# Patient Record
Sex: Female | Born: 1980 | Race: Black or African American | Hispanic: No | Marital: Single | Smoking: Never smoker
Health system: Southern US, Community
[De-identification: ages and names within clinical notes are randomized; demographics above are authoritative.]

## PROBLEM LIST (undated history)

## (undated) DIAGNOSIS — E119 Type 2 diabetes mellitus without complications: Secondary | ICD-10-CM

## (undated) DIAGNOSIS — R87629 Unspecified abnormal cytological findings in specimens from vagina: Secondary | ICD-10-CM

## (undated) DIAGNOSIS — J45909 Unspecified asthma, uncomplicated: Secondary | ICD-10-CM

## (undated) DIAGNOSIS — B009 Herpesviral infection, unspecified: Secondary | ICD-10-CM

## (undated) DIAGNOSIS — N809 Endometriosis, unspecified: Secondary | ICD-10-CM

## (undated) DIAGNOSIS — D219 Benign neoplasm of connective and other soft tissue, unspecified: Secondary | ICD-10-CM

## (undated) HISTORY — DX: Endometriosis, unspecified: N80.9

## (undated) HISTORY — DX: Unspecified asthma, uncomplicated: J45.909

## (undated) HISTORY — DX: Herpesviral infection, unspecified: B00.9

## (undated) HISTORY — DX: Type 2 diabetes mellitus without complications: E11.9

## (undated) HISTORY — PX: DILATION AND CURETTAGE OF UTERUS: SHX78

## (undated) HISTORY — PX: LEEP: SHX91

## (undated) HISTORY — DX: Unspecified abnormal cytological findings in specimens from vagina: R87.629

## (undated) HISTORY — DX: Benign neoplasm of connective and other soft tissue, unspecified: D21.9

## (undated) HISTORY — PX: COLPOSCOPY: SHX161

---

## 2003-11-03 ENCOUNTER — Emergency Department (HOSPITAL_COMMUNITY): Admission: EM | Admit: 2003-11-03 | Discharge: 2003-11-03 | Payer: Self-pay | Admitting: Family Medicine

## 2005-06-19 ENCOUNTER — Ambulatory Visit (HOSPITAL_COMMUNITY): Admission: RE | Admit: 2005-06-19 | Discharge: 2005-06-19 | Payer: Self-pay | Admitting: *Deleted

## 2006-03-23 IMAGING — US US ABDOMEN COMPLETE
1 series · 14 of 25 positions shown · non-contrast
Comparison: none

CLINICAL DATA: Evaluate gallbladder.  Post prandial pain.  
ABDOMEN ULTRASOUND:
TECHNIQUE: Complete abdominal ultrasound examination was performed including evaluation of the liver, gallbladder, bile ducts, pancreas, kidneys, spleen, IVC, and abdominal aorta.

[Series 1: us abdomen complete · 0.39mm/px · 14 of 73 slices shown]
[im 1/73]
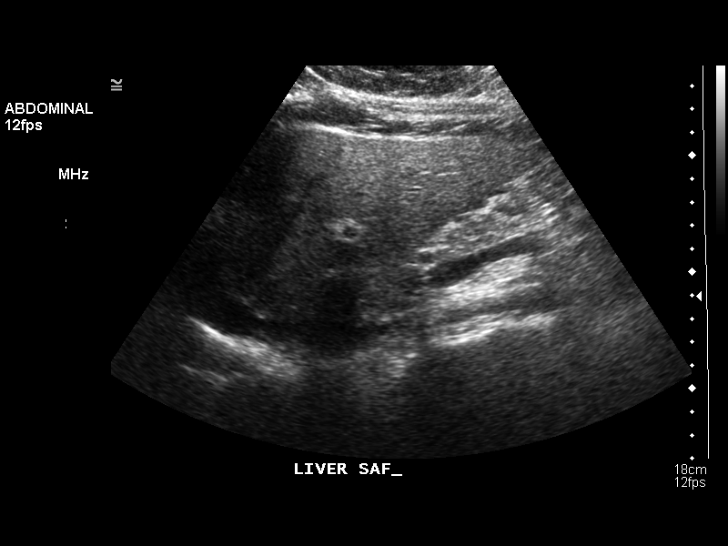
[im 7/73]
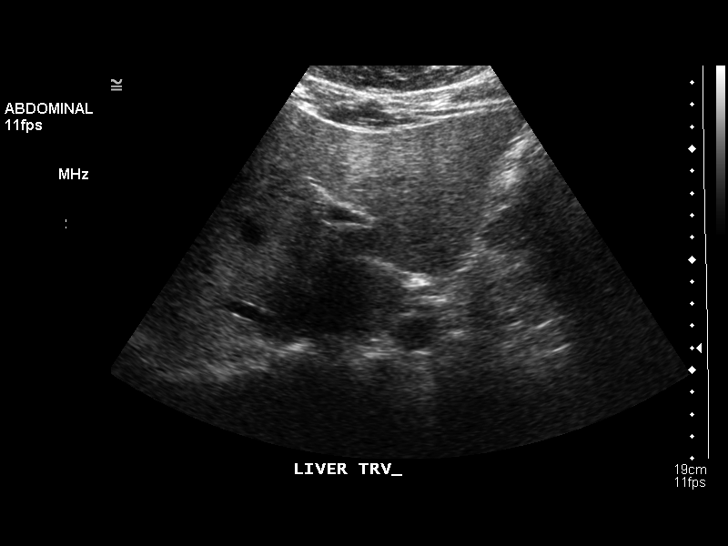
[im 13/73]
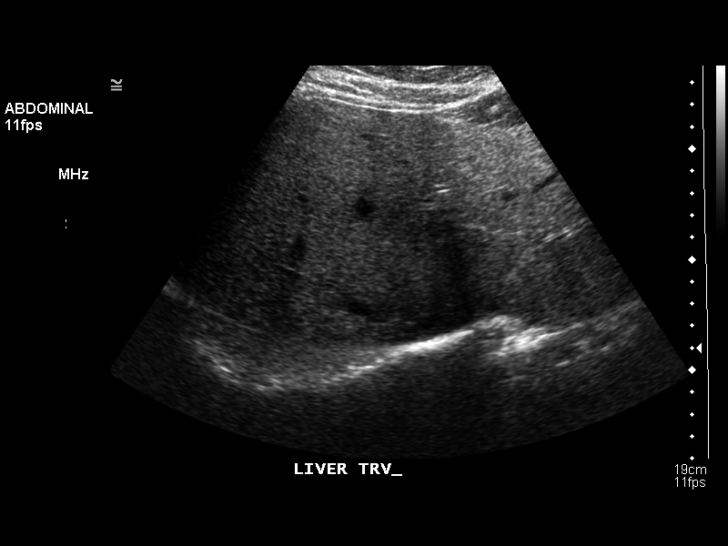
[im 19/73]
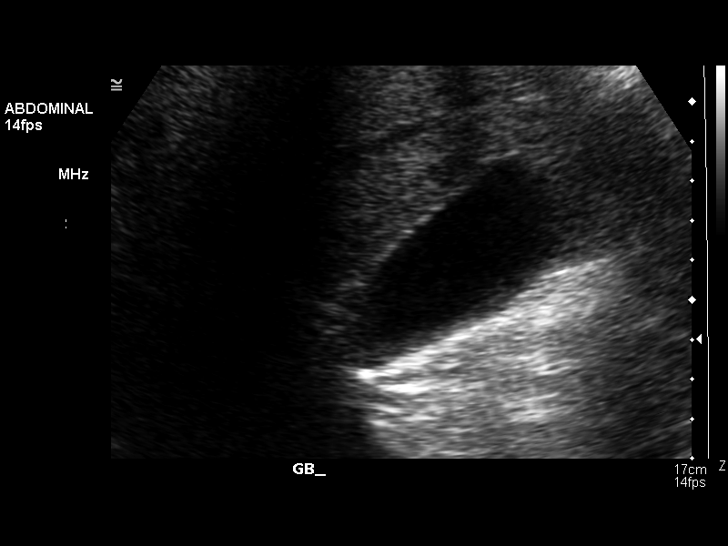
[im 25/73]
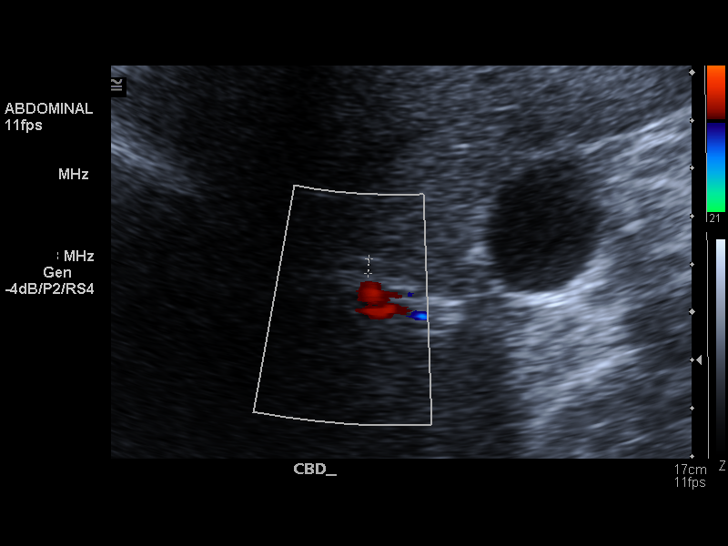
[im 28/73]
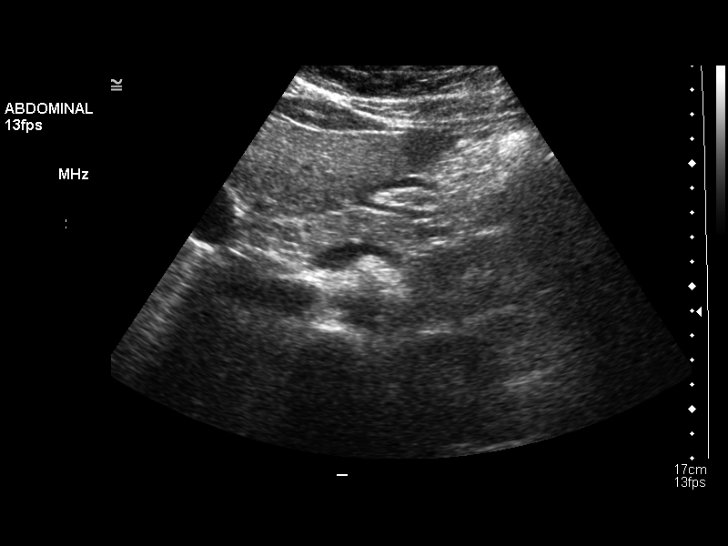
[im 34/73]
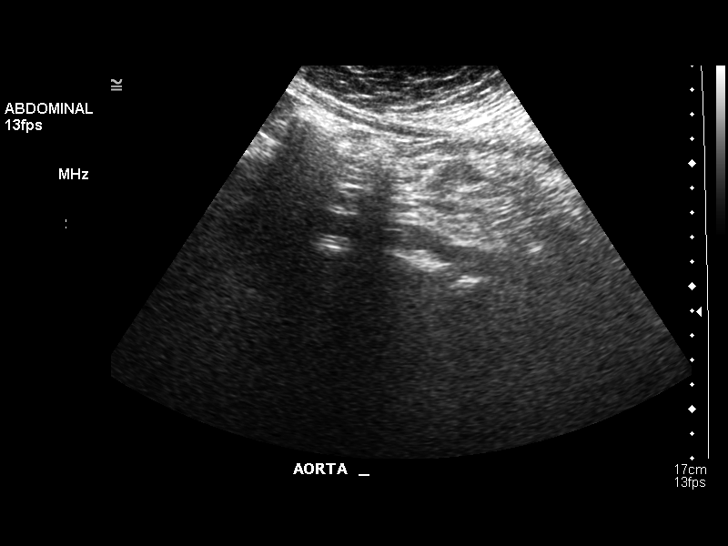
[im 40/73]
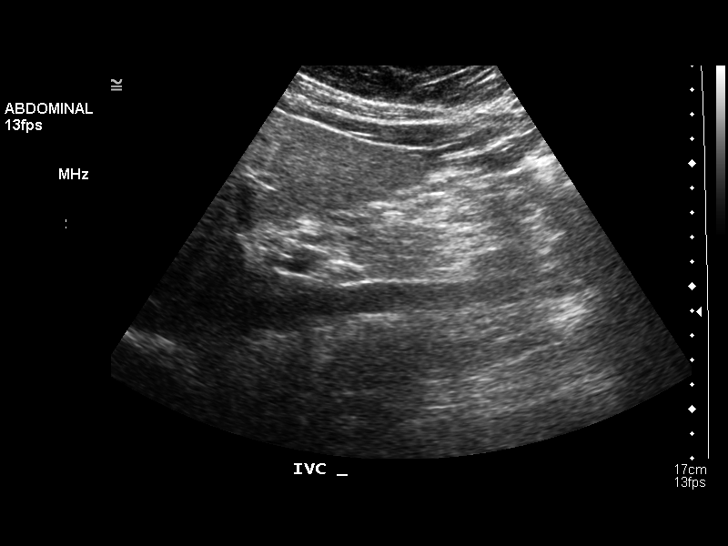
[im 46/73]
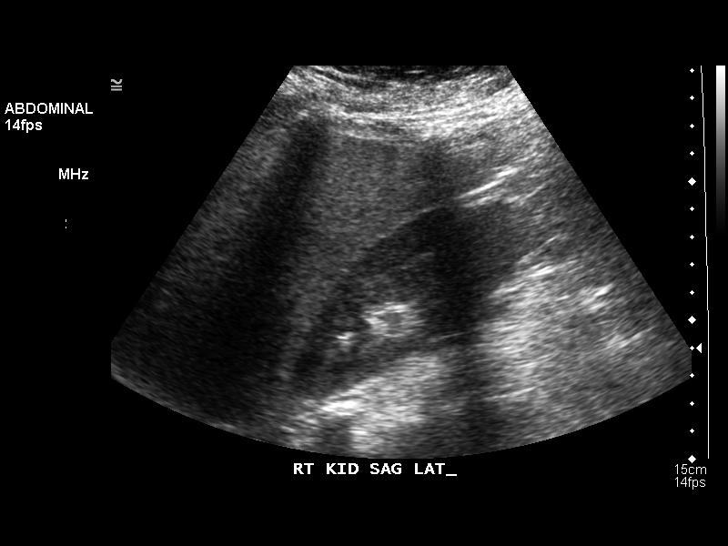
[im 49/73]
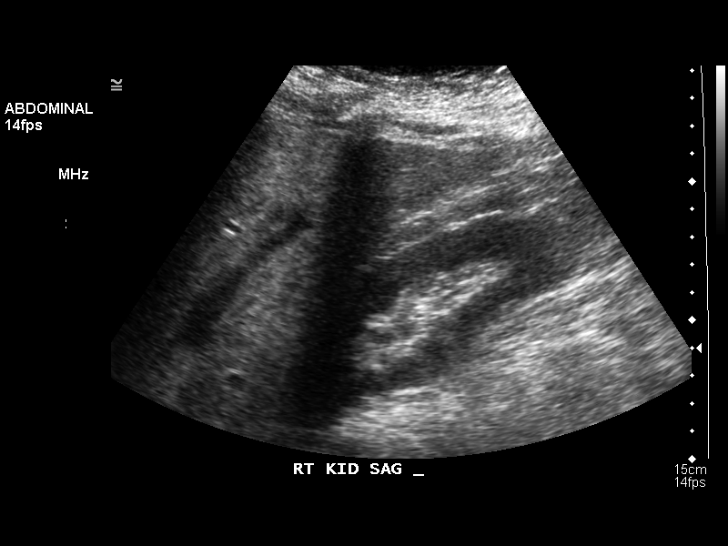
[im 55/73]
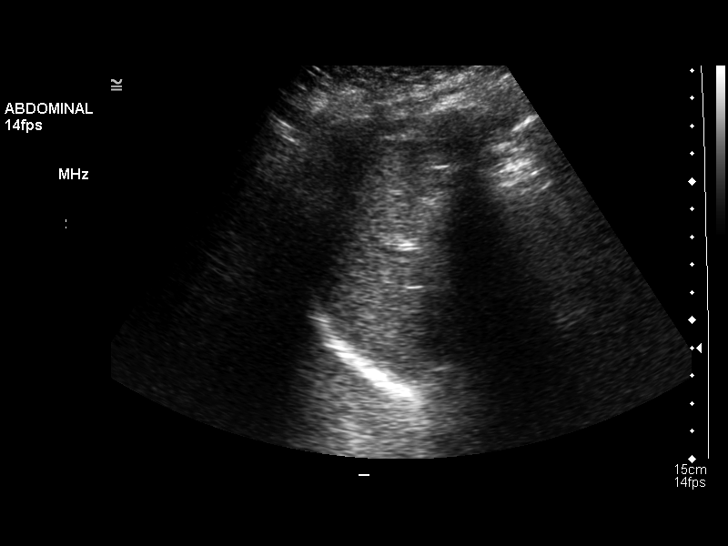
[im 61/73]
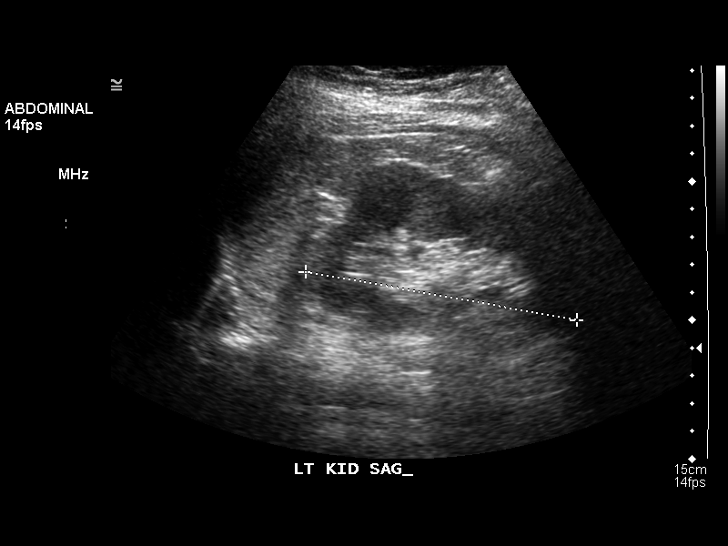
[im 67/73]
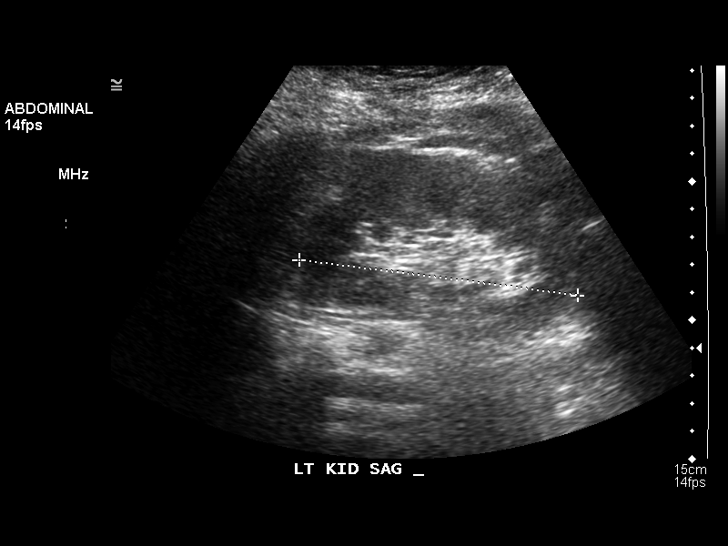
[im 73/73]
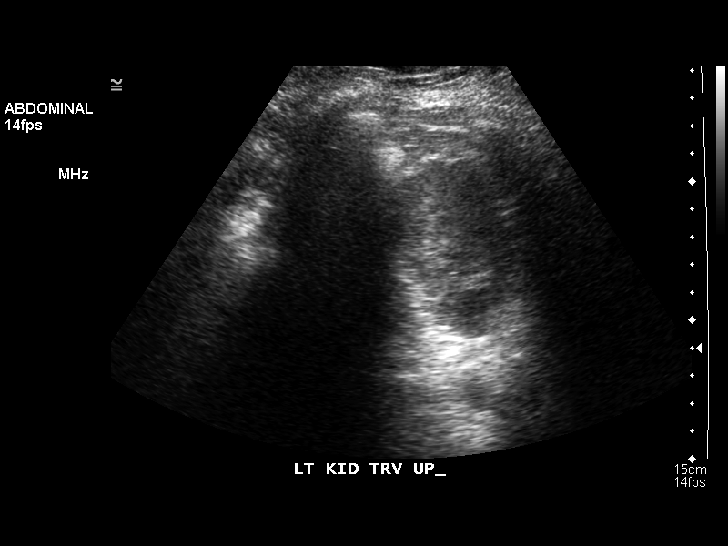

[14 of 25 positions shown; findings below may reference images not displayed]

FINDINGS: The liver parenchyma appears homogeneous with no signs of intrahepatic ductal dilatation.  The gallbladder is well distended and shows no evidence for intraluminal stones or sludge.  No pericholecystic fluid or gallbladder wall thickening is seen and the common bile duct measures 3 mm in AP width and this is within normal limits for size.  The pancreas is seen in its entirety and is normal in size and echotexture.  The proximal aspect of the inferior vena cava has a normal caliber and the aorta is normal in caliber with a maximal width of 1.3 cm.  
Both kidneys are seen with the right kidney having a sagittal length of 11.3 cm and the left kidney having a sagittal length of 10.4 cm.  No focal parenchymal abnormalities or signs of hydronephrosis are evident.  Spleen has a normal sonographic appearance. 
No intra-abdominal fluid is noted.
IMPRESSION: Normal abdominal ultrasound.

## 2006-04-21 ENCOUNTER — Emergency Department (HOSPITAL_COMMUNITY): Admission: EM | Admit: 2006-04-21 | Discharge: 2006-04-21 | Payer: Self-pay | Admitting: Emergency Medicine

## 2006-09-23 ENCOUNTER — Ambulatory Visit: Payer: Self-pay | Admitting: Internal Medicine

## 2007-01-23 IMAGING — CR DG CHEST 2V
2 series · 2 of 2 positions shown · non-contrast
Comparison: None.

CLINICAL DATA: 25-year-old with cough and chest pain. 
 CHEST ? 2 VIEW:

[w chest pa *]
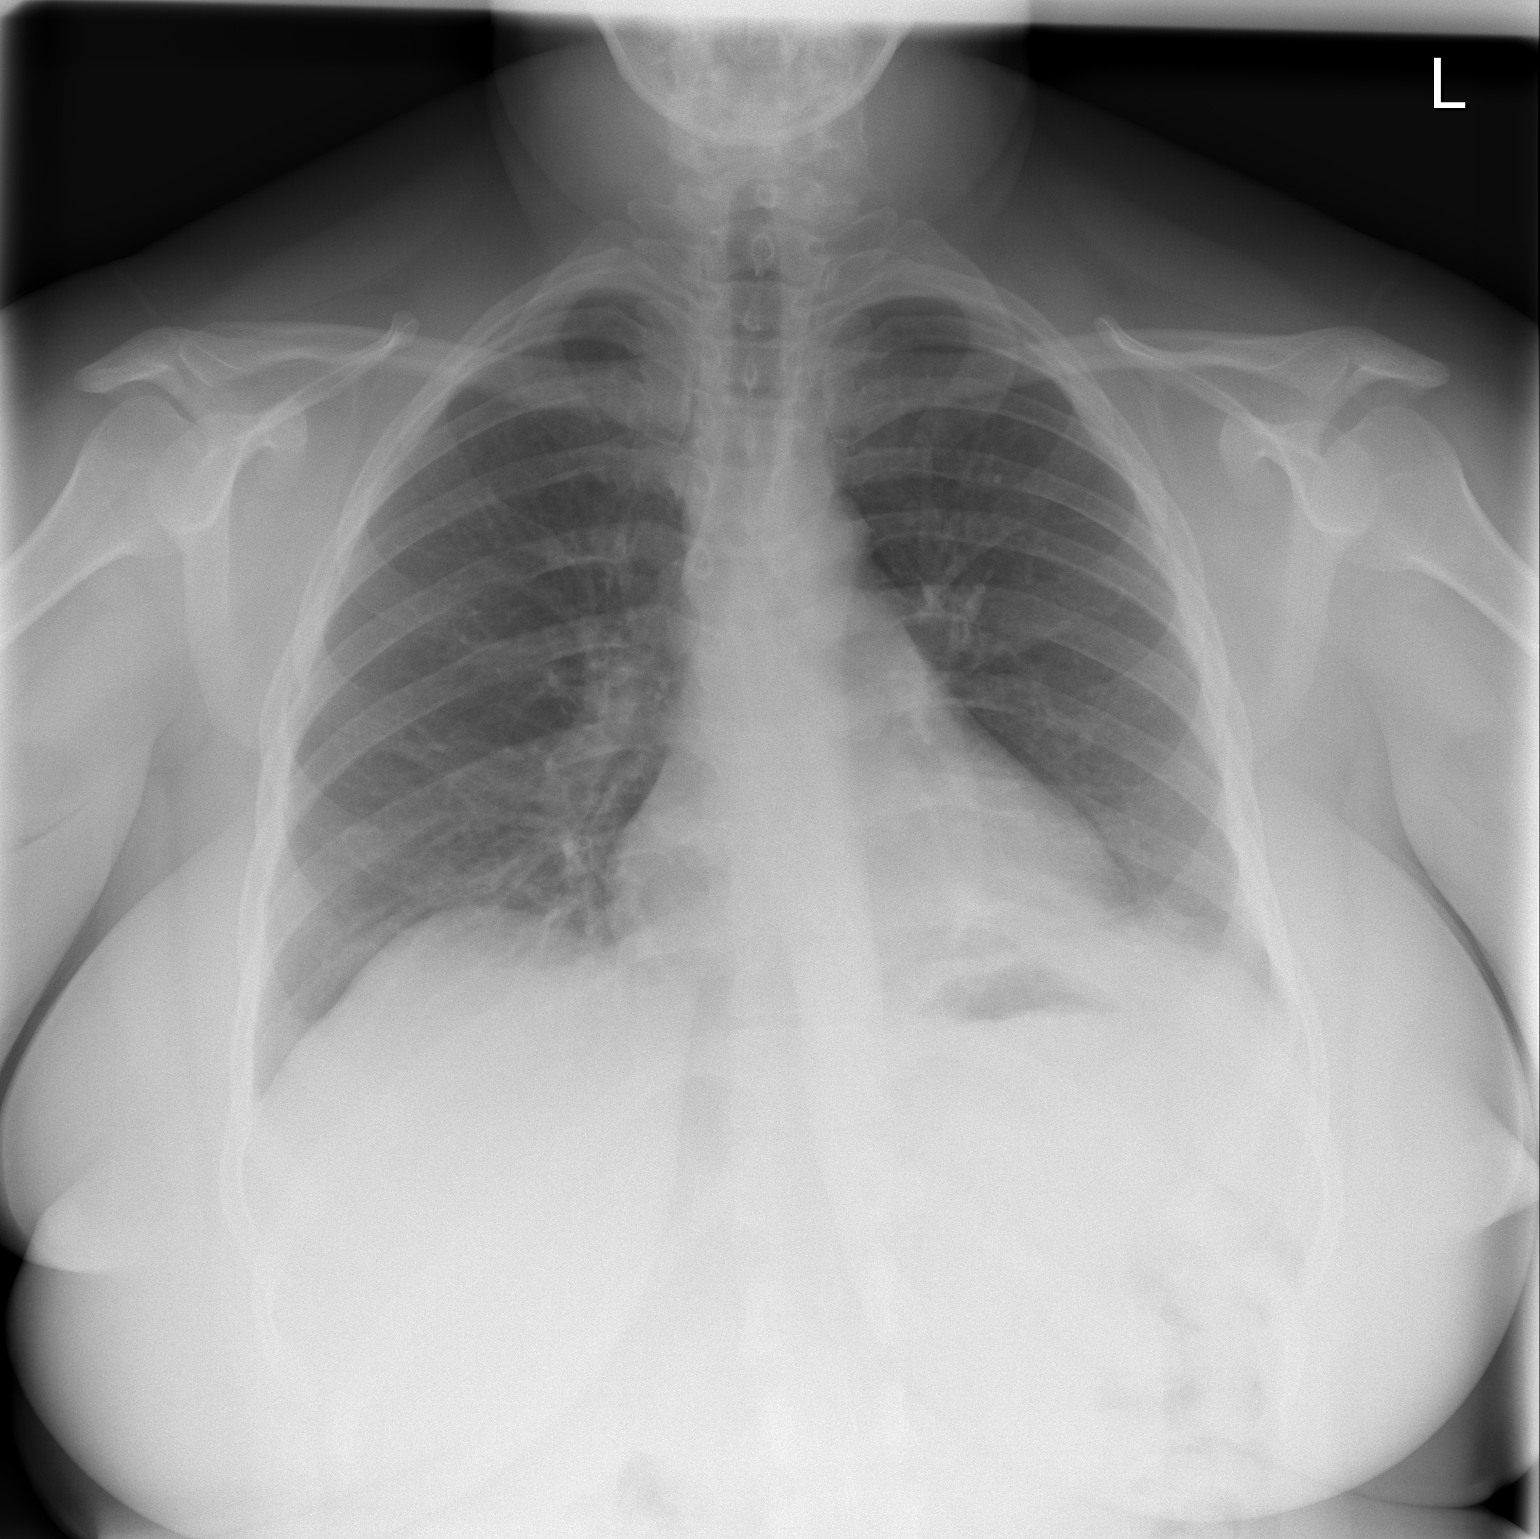

[w chest lat]
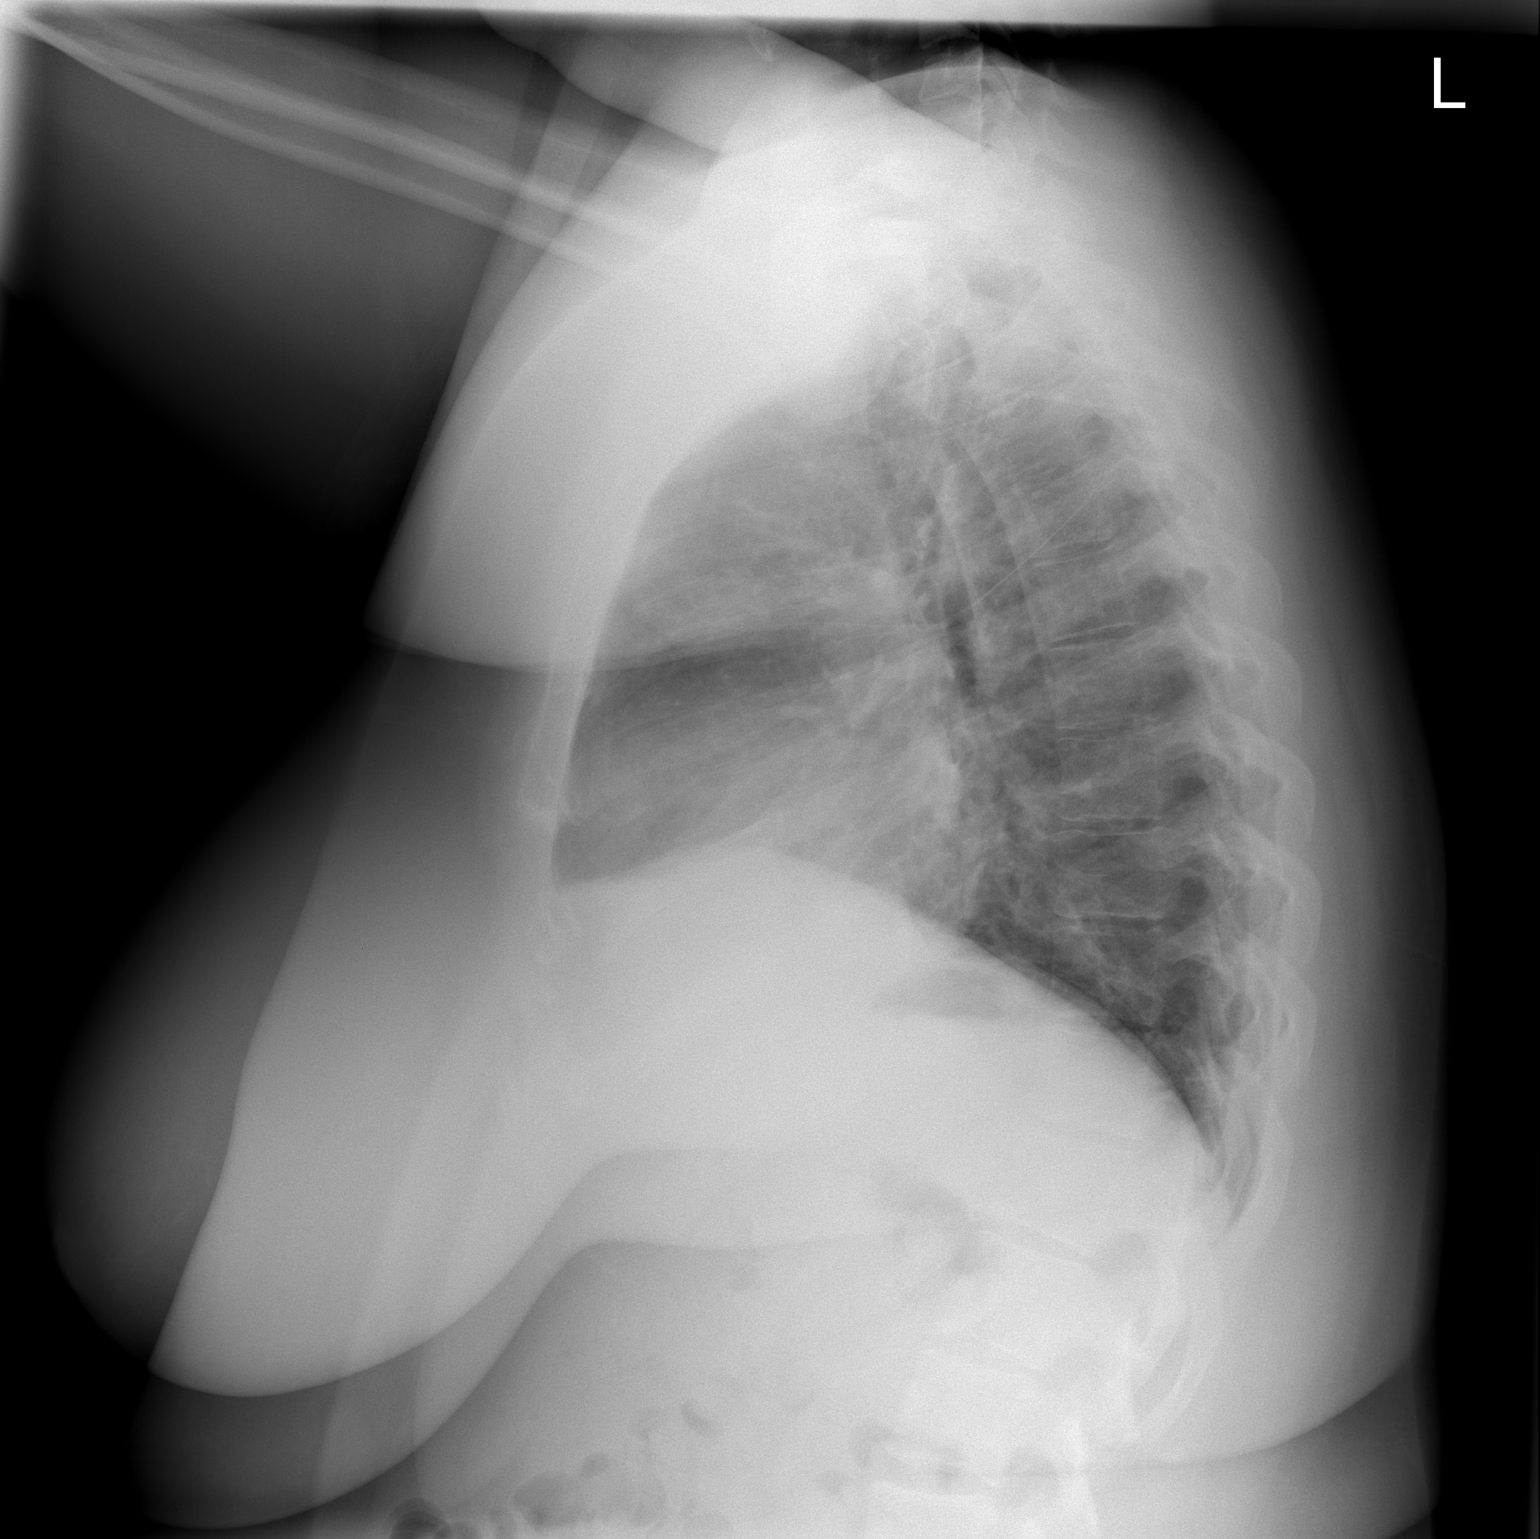

[2 of 2 positions shown; findings below may reference images not displayed]

FINDINGS: Cardiac silhouette, mediastinal and hilar contours are within normal limits.  Left lower lobe opacity may reflect atelectasis or an early infiltrate.  No effusion.  Bony structures are intact.
IMPRESSION: Probably early left lower lobe pneumonia.

## 2007-03-05 ENCOUNTER — Inpatient Hospital Stay (HOSPITAL_COMMUNITY): Admission: AD | Admit: 2007-03-05 | Discharge: 2007-03-05 | Payer: Self-pay | Admitting: Obstetrics

## 2007-03-06 ENCOUNTER — Inpatient Hospital Stay (HOSPITAL_COMMUNITY): Admission: AD | Admit: 2007-03-06 | Discharge: 2007-03-08 | Payer: Self-pay | Admitting: Obstetrics

## 2010-10-25 NOTE — Assessment & Plan Note (Signed)
[facility]                             PULMONARY OFFICE NOTE   ROTUNDA, WORDEN                     MRN:          [id number]  DATE:09/23/2006                            DOB:          [birth year]    REASON FOR CONSULTATION:  Asthma.   CHIEF COMPLAINT:  Dyspnea with chest congestion.   HISTORY:  A 30 year old black female, states she has had asthma all her  life but seemed to outgrow it after the age of 56 except for a bad  problem with colds, during which she gets sick for a couple of weeks  with chest congestion with discolored mucous.  She typically responds to  a round of antibiotics and a short course of albuterol.  She rarely  needs prednisone and does not need any albuterol between her colds.  However, she is now [redacted] weeks pregnant with her 2nd child (note, she was  50 pounds less with her 1st child 4 years ago with no asthma  exacerbation), with a 4-week history of a bad cold that won't go away.  Her main symptom is nocturnal wheeze, coughing, and congestion with  green mucous initially, now just yellow tinged, with increasing  frequency of albuterol use mostly during the evening and nighttime hours  while sleeping.  She denies any pleuritic pain, sinus pain, overt reflux  symptoms, associated fevers, chills, sweats, orthopnea, PND, or leg  swelling.   PAST MEDICAL HISTORY:  Significant for the absence of hypertension and  positive for asthma and allergies.  Although she denies typical  seasonal variation.   ALLERGIES:  None known.   SOCIAL HISTORY:  She has never smoked.  She works as a an Sales executive.   FAMILY HISTORY:  Significant for asthma in her father, and otherwise  negative for atopy or respiratory disease.   REVIEW OF SYSTEMS:  Taken in detail on the worksheet, negative except  for as outlined above.   PHYSICAL EXAMINATION:  This is a pleasant, ambulatory black female in no  acute distress.  Blood pressure 120/68.  HEENT:  Reveals mild turbinate edema, oropharynx clear.  No evidence of  excessive postnasal drip or cobblestoning.  No turbinate sinuses or  polyps or purulent secretions.  Ear canals are clear bilaterally.  NECK:  Supple without cervical adenopathy or tenderness.  Trachea is  midline, no thyromegaly.  Lung fields perfectly clear bilaterally to auscultation and percussion  except for minimum expiratory wheeze with FVC maneuver.  Heart appears regular rate and rhythm without murmurs, gallops, or rubs  or increasing P2.  ABDOMEN:  Obese and otherwise benign and consistent with intrauterine  pregnancy at 17 weeks.  EXTREMITIES:  Warm without calf tenderness, cyanosis, clubbing, or  edema.  Heme saturation is 100% on room air.   IMPRESSION:  Persistent asthma in a patient at [redacted] weeks gestation with  increasing need for nocturnal albuterol over the last 4 weeks.  I  explained to the patient that poor control of asthma is definitely a  risk factor for obstetrical complications, and recommended at this point  instituting Symbicort 80/4.5 two puffs b.i.d. perfectly regularly  until  she returns in 4 weeks.  The goal is to eliminate her symptoms and also  minimize the use of albuterol.  Perhaps she does have discolored sputum,  but this may well be eosinophilic bronchitis that should respond to the  Symbicort as well.  If not, I gave her a course of Augmentin to have on  hand for 10 days p.r.n.  Persistent purulent sputum, I asked her to use  either Mucinex or Robitussin over the counter products as needed.   I did also explain that her asthma may worsen before it gets better as  she gets more pregnant due to both the direct effects of the uterus on  the abdominal compliance, but indirect effect such as reflux and  increased work of breathing related to progestational hot ventilatory  drive issues.     Charlaine Dalton. Sherene Sires, MD, [facility]  Electronically Signed    MBW/MedQ  DD: 09/23/2006  DT:  09/23/2006  Job #: [id number]   cc:   Maxie Better, M.D.

## 2011-03-20 LAB — CBC
HCT: 32.2 — ABNORMAL LOW
Hemoglobin: 10.8 — ABNORMAL LOW
Hemoglobin: 9 — ABNORMAL LOW
MCHC: 33.7
MCV: 82.4
MCV: 83.1
Platelets: 195
Platelets: 206
RBC: 3.91
RDW: 15.6 — ABNORMAL HIGH
WBC: 14.2 — ABNORMAL HIGH

## 2017-02-25 ENCOUNTER — Encounter (HOSPITAL_COMMUNITY): Payer: Self-pay

## 2017-02-25 ENCOUNTER — Ambulatory Visit (HOSPITAL_COMMUNITY): Admit: 2017-02-25 | Payer: Self-pay | Admitting: Obstetrics and Gynecology

## 2017-02-25 SURGERY — HYSTERECTOMY, VAGINAL, LAPAROSCOPY-ASSISTED, WITH SALPINGECTOMY
Anesthesia: General | Laterality: Bilateral

## 2022-06-09 NOTE — L&D Delivery Note (Signed)
Delivery Note Labor onset:   11/28/22 Labor Onset Time: 0340 Complete dilation at 7:04 AM  Onset of pushing at 0825 FHR second stage Cat 2 Analgesia/Anesthesia intrapartum: epidural  Guided pushing with maternal urge. Delivery of a viable female at 46. Fetal head delivered in LOA position.  Nuchal cord: x1, loose, reduced.  Infant placed on maternal abd, dried, and tactile stim.  Cord double clamped after 4 min and cut by pt's friend, Marcelino Duster.  RN x3 present for birth.  Cord blood sample collected: Yes Arterial cord blood sample collected: No  Placenta delivered Tomasa Blase, intact, with 3 VC.  Placenta to L&D. Uterine tone firm, bleeding small with clots, remains firm after maassage  No laceration identified.  QBL/EBL (mL): 300 Complications: none APGAR: APGAR (1 MIN):   APGAR (5 MINS):   APGAR (10 MINS):   Mom to postpartum.  Baby to Couplet care / Skin to Skin. Baby boy "[place]" Circumcision Yes  Roma Schanz DNP, CNM 11/28/2022, 9:10 AM

## 2022-06-27 ENCOUNTER — Other Ambulatory Visit: Payer: Self-pay

## 2022-07-01 LAB — HEPATITIS C ANTIBODY: HCV Ab: NEGATIVE

## 2022-07-01 LAB — OB RESULTS CONSOLE ABO/RH: RH Type: POSITIVE

## 2022-07-01 LAB — OB RESULTS CONSOLE HIV ANTIBODY (ROUTINE TESTING): HIV: NONREACTIVE

## 2022-07-01 LAB — OB RESULTS CONSOLE RPR: RPR: NONREACTIVE

## 2022-07-01 LAB — OB RESULTS CONSOLE RUBELLA ANTIBODY, IGM: Rubella: IMMUNE

## 2022-07-01 LAB — OB RESULTS CONSOLE ANTIBODY SCREEN: Antibody Screen: NEGATIVE

## 2022-07-01 LAB — OB RESULTS CONSOLE HEPATITIS B SURFACE ANTIGEN: Hepatitis B Surface Ag: NEGATIVE

## 2022-07-04 ENCOUNTER — Other Ambulatory Visit: Payer: Self-pay | Admitting: Obstetrics and Gynecology

## 2022-07-04 DIAGNOSIS — E119 Type 2 diabetes mellitus without complications: Secondary | ICD-10-CM

## 2022-07-22 ENCOUNTER — Encounter: Payer: Self-pay | Admitting: *Deleted

## 2022-07-24 ENCOUNTER — Ambulatory Visit: Payer: PRIVATE HEALTH INSURANCE | Attending: Obstetrics and Gynecology

## 2022-07-24 ENCOUNTER — Encounter: Payer: Self-pay | Admitting: *Deleted

## 2022-07-24 ENCOUNTER — Other Ambulatory Visit: Payer: Self-pay | Admitting: *Deleted

## 2022-07-24 ENCOUNTER — Ambulatory Visit: Payer: PRIVATE HEALTH INSURANCE | Admitting: *Deleted

## 2022-07-24 VITALS — BP 106/64 | HR 88 | Ht 62.0 in

## 2022-07-24 DIAGNOSIS — Z362 Encounter for other antenatal screening follow-up: Secondary | ICD-10-CM

## 2022-07-24 DIAGNOSIS — O24112 Pre-existing diabetes mellitus, type 2, in pregnancy, second trimester: Secondary | ICD-10-CM | POA: Diagnosis present

## 2022-07-24 DIAGNOSIS — Z794 Long term (current) use of insulin: Secondary | ICD-10-CM

## 2022-07-24 DIAGNOSIS — O99212 Obesity complicating pregnancy, second trimester: Secondary | ICD-10-CM

## 2022-07-24 DIAGNOSIS — O09522 Supervision of elderly multigravida, second trimester: Secondary | ICD-10-CM

## 2022-07-24 DIAGNOSIS — E119 Type 2 diabetes mellitus without complications: Secondary | ICD-10-CM

## 2022-07-24 DIAGNOSIS — E669 Obesity, unspecified: Secondary | ICD-10-CM

## 2022-07-24 DIAGNOSIS — Z3A19 19 weeks gestation of pregnancy: Secondary | ICD-10-CM

## 2022-08-28 ENCOUNTER — Other Ambulatory Visit: Payer: Self-pay | Admitting: *Deleted

## 2022-08-28 ENCOUNTER — Ambulatory Visit: Payer: PRIVATE HEALTH INSURANCE | Admitting: *Deleted

## 2022-08-28 ENCOUNTER — Ambulatory Visit: Payer: PRIVATE HEALTH INSURANCE | Attending: Obstetrics and Gynecology

## 2022-08-28 VITALS — BP 99/67 | HR 84

## 2022-08-28 DIAGNOSIS — O24112 Pre-existing diabetes mellitus, type 2, in pregnancy, second trimester: Secondary | ICD-10-CM | POA: Diagnosis present

## 2022-08-28 DIAGNOSIS — E669 Obesity, unspecified: Secondary | ICD-10-CM

## 2022-08-28 DIAGNOSIS — Z362 Encounter for other antenatal screening follow-up: Secondary | ICD-10-CM | POA: Insufficient documentation

## 2022-08-28 DIAGNOSIS — Z3A24 24 weeks gestation of pregnancy: Secondary | ICD-10-CM

## 2022-08-28 DIAGNOSIS — O99212 Obesity complicating pregnancy, second trimester: Secondary | ICD-10-CM | POA: Diagnosis present

## 2022-08-28 DIAGNOSIS — E119 Type 2 diabetes mellitus without complications: Secondary | ICD-10-CM

## 2022-08-28 DIAGNOSIS — O09522 Supervision of elderly multigravida, second trimester: Secondary | ICD-10-CM | POA: Diagnosis present

## 2022-08-28 DIAGNOSIS — Z794 Long term (current) use of insulin: Secondary | ICD-10-CM

## 2022-09-25 ENCOUNTER — Other Ambulatory Visit: Payer: Self-pay | Admitting: *Deleted

## 2022-09-25 ENCOUNTER — Ambulatory Visit: Payer: PRIVATE HEALTH INSURANCE | Admitting: *Deleted

## 2022-09-25 ENCOUNTER — Ambulatory Visit: Payer: PRIVATE HEALTH INSURANCE | Attending: Maternal & Fetal Medicine

## 2022-09-25 VITALS — BP 104/63 | HR 82

## 2022-09-25 DIAGNOSIS — Z794 Long term (current) use of insulin: Secondary | ICD-10-CM

## 2022-09-25 DIAGNOSIS — Z3A28 28 weeks gestation of pregnancy: Secondary | ICD-10-CM

## 2022-09-25 DIAGNOSIS — O99212 Obesity complicating pregnancy, second trimester: Secondary | ICD-10-CM | POA: Diagnosis present

## 2022-09-25 DIAGNOSIS — E119 Type 2 diabetes mellitus without complications: Secondary | ICD-10-CM

## 2022-09-25 DIAGNOSIS — O24913 Unspecified diabetes mellitus in pregnancy, third trimester: Secondary | ICD-10-CM

## 2022-09-25 DIAGNOSIS — O09522 Supervision of elderly multigravida, second trimester: Secondary | ICD-10-CM | POA: Insufficient documentation

## 2022-09-25 DIAGNOSIS — O24113 Pre-existing diabetes mellitus, type 2, in pregnancy, third trimester: Secondary | ICD-10-CM | POA: Diagnosis not present

## 2022-09-25 DIAGNOSIS — O99213 Obesity complicating pregnancy, third trimester: Secondary | ICD-10-CM

## 2022-09-25 DIAGNOSIS — O24112 Pre-existing diabetes mellitus, type 2, in pregnancy, second trimester: Secondary | ICD-10-CM | POA: Insufficient documentation

## 2022-09-25 DIAGNOSIS — E669 Obesity, unspecified: Secondary | ICD-10-CM

## 2022-09-25 DIAGNOSIS — O09523 Supervision of elderly multigravida, third trimester: Secondary | ICD-10-CM | POA: Diagnosis not present

## 2022-10-21 DIAGNOSIS — O09529 Supervision of elderly multigravida, unspecified trimester: Secondary | ICD-10-CM | POA: Insufficient documentation

## 2022-10-21 DIAGNOSIS — O99210 Obesity complicating pregnancy, unspecified trimester: Secondary | ICD-10-CM | POA: Insufficient documentation

## 2022-10-21 DIAGNOSIS — O24113 Pre-existing diabetes mellitus, type 2, in pregnancy, third trimester: Secondary | ICD-10-CM | POA: Insufficient documentation

## 2022-10-21 NOTE — Assessment & Plan Note (Deleted)
Insulin required

## 2022-10-23 ENCOUNTER — Ambulatory Visit: Payer: PRIVATE HEALTH INSURANCE | Admitting: *Deleted

## 2022-10-23 ENCOUNTER — Ambulatory Visit: Payer: PRIVATE HEALTH INSURANCE | Attending: Obstetrics and Gynecology | Admitting: Maternal & Fetal Medicine

## 2022-10-23 ENCOUNTER — Ambulatory Visit: Payer: PRIVATE HEALTH INSURANCE | Attending: Maternal & Fetal Medicine

## 2022-10-23 VITALS — BP 99/49 | HR 89

## 2022-10-23 DIAGNOSIS — O99212 Obesity complicating pregnancy, second trimester: Secondary | ICD-10-CM | POA: Insufficient documentation

## 2022-10-23 DIAGNOSIS — O3663X0 Maternal care for excessive fetal growth, third trimester, not applicable or unspecified: Secondary | ICD-10-CM | POA: Diagnosis not present

## 2022-10-23 DIAGNOSIS — E669 Obesity, unspecified: Secondary | ICD-10-CM

## 2022-10-23 DIAGNOSIS — O09522 Supervision of elderly multigravida, second trimester: Secondary | ICD-10-CM | POA: Insufficient documentation

## 2022-10-23 DIAGNOSIS — O24113 Pre-existing diabetes mellitus, type 2, in pregnancy, third trimester: Secondary | ICD-10-CM | POA: Insufficient documentation

## 2022-10-23 DIAGNOSIS — Z3A32 32 weeks gestation of pregnancy: Secondary | ICD-10-CM | POA: Diagnosis not present

## 2022-10-23 DIAGNOSIS — O09523 Supervision of elderly multigravida, third trimester: Secondary | ICD-10-CM | POA: Insufficient documentation

## 2022-10-23 DIAGNOSIS — O99210 Obesity complicating pregnancy, unspecified trimester: Secondary | ICD-10-CM | POA: Insufficient documentation

## 2022-10-23 DIAGNOSIS — E119 Type 2 diabetes mellitus without complications: Secondary | ICD-10-CM

## 2022-10-23 DIAGNOSIS — O99213 Obesity complicating pregnancy, third trimester: Secondary | ICD-10-CM

## 2022-10-23 DIAGNOSIS — O24112 Pre-existing diabetes mellitus, type 2, in pregnancy, second trimester: Secondary | ICD-10-CM | POA: Insufficient documentation

## 2022-10-23 DIAGNOSIS — Z794 Long term (current) use of insulin: Secondary | ICD-10-CM

## 2022-10-23 NOTE — Progress Notes (Signed)
Patient information  Patient Name: Kylie Gamble  Patient MRN:   [id number]  Referring practice: MFM Referring Provider: [facility])  MFM CONSULT  Kylie Gamble is a 42 y.o. [id number] at [redacted]w[redacted]d here for ultrasound and consultation.    The patient reports fasting blood sugars ranging from the 80s to the low 100s.  She reports over 50% of the time it is over 95.  She also reports that her postprandial breakfast 2-hour sugar levels are in the upper 100s to 250.  However then she drops below 40 just before lunch.  The remaining of her postprandial blood sugar values 2 hours after eating are within the 120s to 150s.  She does not have her log with her but recalls this from memory.  She is currently taking Lantus 30 units at night Humalog 40 units with breakfast and 30 with lunch and dinner.  I instructed her to minimize carbohydrate and sugar intake especially at breakfast in addition to changing her medication.  I instructed her to increase her Lantus to 34 units at night and decrease her breakfast Humalog to 38 units.  I also recommend that every day she has fasting hyperglycemia she increase her Lantus by 2 units at night.  Hopefully the increase in protein and fats will help stabilize her blood sugar breakfast and throughout the day.  I discussed the clinical importance of proper glycemic control during pregnancy to minimize risk for things like stillbirth.  She verbalized understanding and is agreement with the plan.  Sonographic findings Single intrauterine pregnancy. Fetal cardiac activity: Observed. Presentation: Transverse, head to maternal left. Interval fetal anatomy appears normal.  Fetal biometry shows the estimated fetal weight at the 97 percentile.  Amniotic fluid volume: Within normal limits. AFI: 21.75 cm.  MVP: 7.41 cm. Placenta: Posterior. BPP: 8/8.   Assessment Type 2 diabetes mellitus affecting pregnancy in third trimester, antepartum  [redacted] weeks  gestation of pregnancy Plan -Minimize carbohydrate, artifical sweetener and sugar intake especially at breakfast -Increase Lantus to 34 units at night.   I also recommend that every day she has fasting hyperglycemia she increase her Lantus by 2 units at night.  -Decrease breakfast Humalog to 38 units. -Continue weekly antenatal testing and serial growth ultrasounds as scheduled. -Livery around 37 weeks due to large for gestational age fetus in the setting of uncontrolled diabetes  Review of Systems: A review of systems was performed and was negative except per HPI   Vitals and Physical Exam    10/23/2022    7:46 AM 09/25/2022    7:34 AM 08/28/2022    7:27 AM  Vitals with BMI  Systolic 99 104 99  Diastolic 49 63 67  Pulse 89 82 84  Sitting comfortably on the sonogram table Nonlabored breathing Normal rate and rhythm Abdomen is nontender  Past pregnancies OB History  Gravida Para Term Preterm AB Living  4 2 2   1 2   SAB IAB Ectopic Multiple Live Births    1          # Outcome Date GA Lbr Len/2nd Weight Sex Delivery Anes PTL Lv  4 Current           3 IAB           2 Term           1 Term             I spent 45 minutes reviewing the patients chart, including labs and images  as well as counseling the patient about her medical conditions. Greater than 50% of the time was spent in direct face-to-face patient counseling.  Braxton Feathers  MFM, [facility]   10/23/2022  10:05 AM

## 2022-10-30 ENCOUNTER — Ambulatory Visit: Payer: PRIVATE HEALTH INSURANCE | Admitting: *Deleted

## 2022-10-30 ENCOUNTER — Ambulatory Visit: Payer: PRIVATE HEALTH INSURANCE | Attending: Maternal & Fetal Medicine | Admitting: *Deleted

## 2022-10-30 VITALS — BP 120/64 | HR 85

## 2022-10-30 DIAGNOSIS — Z3A33 33 weeks gestation of pregnancy: Secondary | ICD-10-CM | POA: Diagnosis not present

## 2022-10-30 DIAGNOSIS — O24113 Pre-existing diabetes mellitus, type 2, in pregnancy, third trimester: Secondary | ICD-10-CM

## 2022-10-30 DIAGNOSIS — O24414 Gestational diabetes mellitus in pregnancy, insulin controlled: Secondary | ICD-10-CM

## 2022-10-30 DIAGNOSIS — O99213 Obesity complicating pregnancy, third trimester: Secondary | ICD-10-CM | POA: Insufficient documentation

## 2022-10-30 DIAGNOSIS — O09523 Supervision of elderly multigravida, third trimester: Secondary | ICD-10-CM | POA: Diagnosis not present

## 2022-10-30 NOTE — Procedures (Signed)
Kylie Gamble Feb 02, 1981 [redacted]w[redacted]d  Fetus A Non-Stress Test Interpretation for 10/30/22  Indication: Diabetes and Advanced Maternal Age >40 years  Fetal Heart Rate A Mode: External Baseline Rate (A): 135 bpm Variability: Moderate Accelerations: 15 x 15 Decelerations: None Multiple birth?: No  Uterine Activity Mode: Toco Contraction Frequency (min): none Resting Tone Palpated: Relaxed  Interpretation (Fetal Testing) Nonstress Test Interpretation: Reactive Overall Impression: Reassuring for gestational age Comments: Tracing reviewed byDr. Parke Poisson

## 2022-11-06 ENCOUNTER — Other Ambulatory Visit: Payer: Self-pay | Admitting: Obstetrics and Gynecology

## 2022-11-06 ENCOUNTER — Ambulatory Visit: Payer: PRIVATE HEALTH INSURANCE | Attending: Maternal & Fetal Medicine | Admitting: *Deleted

## 2022-11-06 ENCOUNTER — Ambulatory Visit: Payer: PRIVATE HEALTH INSURANCE | Attending: Obstetrics and Gynecology | Admitting: *Deleted

## 2022-11-06 ENCOUNTER — Ambulatory Visit: Payer: PRIVATE HEALTH INSURANCE

## 2022-11-06 ENCOUNTER — Ambulatory Visit (HOSPITAL_BASED_OUTPATIENT_CLINIC_OR_DEPARTMENT_OTHER): Payer: PRIVATE HEALTH INSURANCE

## 2022-11-06 VITALS — BP 114/66 | HR 102

## 2022-11-06 DIAGNOSIS — O99213 Obesity complicating pregnancy, third trimester: Secondary | ICD-10-CM

## 2022-11-06 DIAGNOSIS — O3663X0 Maternal care for excessive fetal growth, third trimester, not applicable or unspecified: Secondary | ICD-10-CM | POA: Diagnosis not present

## 2022-11-06 DIAGNOSIS — O24113 Pre-existing diabetes mellitus, type 2, in pregnancy, third trimester: Secondary | ICD-10-CM

## 2022-11-06 DIAGNOSIS — O09523 Supervision of elderly multigravida, third trimester: Secondary | ICD-10-CM | POA: Insufficient documentation

## 2022-11-06 DIAGNOSIS — Z3A37 37 weeks gestation of pregnancy: Secondary | ICD-10-CM | POA: Diagnosis not present

## 2022-11-06 DIAGNOSIS — Z362 Encounter for other antenatal screening follow-up: Secondary | ICD-10-CM | POA: Diagnosis not present

## 2022-11-06 DIAGNOSIS — Z794 Long term (current) use of insulin: Secondary | ICD-10-CM

## 2022-11-06 DIAGNOSIS — O24913 Unspecified diabetes mellitus in pregnancy, third trimester: Secondary | ICD-10-CM

## 2022-11-06 DIAGNOSIS — E11649 Type 2 diabetes mellitus with hypoglycemia without coma: Secondary | ICD-10-CM | POA: Diagnosis present

## 2022-11-06 DIAGNOSIS — E119 Type 2 diabetes mellitus without complications: Secondary | ICD-10-CM | POA: Diagnosis not present

## 2022-11-06 DIAGNOSIS — Z3A34 34 weeks gestation of pregnancy: Secondary | ICD-10-CM

## 2022-11-06 DIAGNOSIS — E669 Obesity, unspecified: Secondary | ICD-10-CM

## 2022-11-06 DIAGNOSIS — O403XX0 Polyhydramnios, third trimester, not applicable or unspecified: Secondary | ICD-10-CM | POA: Diagnosis not present

## 2022-11-06 NOTE — Procedures (Signed)
Kylie Gamble 02/03/1981 [redacted]w[redacted]d  Fetus A Non-Stress Test Interpretation for 11/06/22  Indication: Diabetes and Advanced Maternal Age >40 years  Fetal Heart Rate A Mode: External Baseline Rate (A): 130 bpm Variability: Moderate Accelerations: 15 x 15 Decelerations: None Multiple birth?: No  Uterine Activity Mode: Toco Contraction Frequency (min): none Resting Tone Palpated: Relaxed  Interpretation (Fetal Testing) Nonstress Test Interpretation: Reactive Overall Impression: Reassuring for gestational age Comments: Tracing reviewed byDr. Judeth Cornfield

## 2022-11-06 NOTE — Procedures (Signed)
KAIELLE RESTA 1981-05-12 [redacted]w[redacted]d  Fetus A Non-Stress Test Interpretation for 11/06/22  Indication: Gestational Diabetes medication controlled and Advanced Maternal Age >40 years  Fetal Heart Rate A Mode: External Baseline Rate (A): 130 bpm Variability: Moderate Accelerations: 15 x 15 Decelerations: None Multiple birth?: No  Uterine Activity Mode: Toco Contraction Frequency (min): none Resting Tone Palpated: Relaxed  Interpretation (Fetal Testing) Nonstress Test Interpretation: Reactive Overall Impression: Reassuring for gestational age Comments: Tracing reviewed byDr. Judeth Cornfield

## 2022-11-13 ENCOUNTER — Ambulatory Visit: Payer: PRIVATE HEALTH INSURANCE | Attending: Maternal & Fetal Medicine | Admitting: *Deleted

## 2022-11-13 ENCOUNTER — Ambulatory Visit: Payer: PRIVATE HEALTH INSURANCE | Admitting: *Deleted

## 2022-11-13 VITALS — BP 115/48 | HR 112

## 2022-11-13 DIAGNOSIS — Z3A35 35 weeks gestation of pregnancy: Secondary | ICD-10-CM | POA: Diagnosis not present

## 2022-11-13 DIAGNOSIS — O24113 Pre-existing diabetes mellitus, type 2, in pregnancy, third trimester: Secondary | ICD-10-CM | POA: Insufficient documentation

## 2022-11-13 DIAGNOSIS — O99213 Obesity complicating pregnancy, third trimester: Secondary | ICD-10-CM

## 2022-11-13 LAB — OB RESULTS CONSOLE GBS: GBS: POSITIVE

## 2022-11-13 NOTE — Procedures (Cosign Needed Addendum)
Kylie Gamble [birth year] [redacted]w[redacted]d  Fetus A Non-Stress Test Interpretation for 11/13/22  Indication: Diabetes and Advanced Maternal Age >40 years  Fetal Heart Rate A Mode: External Baseline Rate (A): 135 bpm Variability: Moderate Accelerations: 15 x 15 Decelerations: None Multiple birth?: No  Uterine Activity Mode: Toco Contraction Frequency (min): one u/c during NST Contraction Duration (sec): 90 Contraction Quality: Mild Resting Tone Palpated: Relaxed  Interpretation (Fetal Testing) Nonstress Test Interpretation: Reactive Overall Impression: Reassuring for gestational age Comments: tracing reviewed byDr. Darra Lis

## 2022-11-13 NOTE — Progress Notes (Signed)
Pt c/o RUQ pain that is achey in nature for the past two weeks. She also is very swollen from her knees down to her toes; although not pitting. States baby has decreased movement for the past three days.

## 2022-11-20 ENCOUNTER — Ambulatory Visit: Payer: PRIVATE HEALTH INSURANCE | Admitting: *Deleted

## 2022-11-20 ENCOUNTER — Ambulatory Visit: Payer: PRIVATE HEALTH INSURANCE | Attending: Maternal & Fetal Medicine

## 2022-11-20 VITALS — BP 101/61 | HR 77

## 2022-11-20 DIAGNOSIS — O09523 Supervision of elderly multigravida, third trimester: Secondary | ICD-10-CM | POA: Insufficient documentation

## 2022-11-20 DIAGNOSIS — O99212 Obesity complicating pregnancy, second trimester: Secondary | ICD-10-CM | POA: Insufficient documentation

## 2022-11-20 DIAGNOSIS — O24113 Pre-existing diabetes mellitus, type 2, in pregnancy, third trimester: Secondary | ICD-10-CM

## 2022-11-20 DIAGNOSIS — O3663X0 Maternal care for excessive fetal growth, third trimester, not applicable or unspecified: Secondary | ICD-10-CM | POA: Diagnosis not present

## 2022-11-20 DIAGNOSIS — O24112 Pre-existing diabetes mellitus, type 2, in pregnancy, second trimester: Secondary | ICD-10-CM | POA: Diagnosis present

## 2022-11-20 DIAGNOSIS — O09522 Supervision of elderly multigravida, second trimester: Secondary | ICD-10-CM | POA: Diagnosis present

## 2022-11-20 DIAGNOSIS — E669 Obesity, unspecified: Secondary | ICD-10-CM

## 2022-11-20 DIAGNOSIS — Z794 Long term (current) use of insulin: Secondary | ICD-10-CM | POA: Diagnosis not present

## 2022-11-20 DIAGNOSIS — Z3A36 36 weeks gestation of pregnancy: Secondary | ICD-10-CM

## 2022-11-21 ENCOUNTER — Telehealth (HOSPITAL_COMMUNITY): Payer: Self-pay | Admitting: *Deleted

## 2022-11-21 NOTE — Telephone Encounter (Signed)
Preadmission screen  

## 2022-11-25 ENCOUNTER — Encounter (HOSPITAL_COMMUNITY): Payer: Self-pay

## 2022-11-27 ENCOUNTER — Other Ambulatory Visit: Payer: Self-pay

## 2022-11-27 ENCOUNTER — Inpatient Hospital Stay (HOSPITAL_COMMUNITY)
Admission: RE | Admit: 2022-11-27 | Discharge: 2022-11-30 | DRG: 805 | Disposition: A | Discharge status: Home / Self Care | Payer: PRIVATE HEALTH INSURANCE | Attending: Obstetrics & Gynecology | Admitting: Obstetrics & Gynecology

## 2022-11-27 ENCOUNTER — Encounter (HOSPITAL_COMMUNITY): Payer: Self-pay | Admitting: Obstetrics and Gynecology

## 2022-11-27 DIAGNOSIS — I890 Lymphedema, not elsewhere classified: Secondary | ICD-10-CM | POA: Diagnosis not present

## 2022-11-27 DIAGNOSIS — O3663X0 Maternal care for excessive fetal growth, third trimester, not applicable or unspecified: Secondary | ICD-10-CM | POA: Diagnosis present

## 2022-11-27 DIAGNOSIS — Z3A37 37 weeks gestation of pregnancy: Secondary | ICD-10-CM

## 2022-11-27 DIAGNOSIS — O99214 Obesity complicating childbirth: Secondary | ICD-10-CM | POA: Diagnosis present

## 2022-11-27 DIAGNOSIS — O9942 Diseases of the circulatory system complicating childbirth: Secondary | ICD-10-CM | POA: Diagnosis not present

## 2022-11-27 DIAGNOSIS — E1165 Type 2 diabetes mellitus with hyperglycemia: Secondary | ICD-10-CM | POA: Diagnosis present

## 2022-11-27 DIAGNOSIS — A6000 Herpesviral infection of urogenital system, unspecified: Secondary | ICD-10-CM | POA: Diagnosis present

## 2022-11-27 DIAGNOSIS — J45909 Unspecified asthma, uncomplicated: Secondary | ICD-10-CM | POA: Diagnosis present

## 2022-11-27 DIAGNOSIS — Z794 Long term (current) use of insulin: Secondary | ICD-10-CM

## 2022-11-27 DIAGNOSIS — O99824 Streptococcus B carrier state complicating childbirth: Secondary | ICD-10-CM | POA: Diagnosis present

## 2022-11-27 DIAGNOSIS — O2412 Pre-existing diabetes mellitus, type 2, in childbirth: Principal | ICD-10-CM | POA: Diagnosis present

## 2022-11-27 DIAGNOSIS — O9832 Other infections with a predominantly sexual mode of transmission complicating childbirth: Secondary | ICD-10-CM | POA: Diagnosis present

## 2022-11-27 DIAGNOSIS — O24319 Unspecified pre-existing diabetes mellitus in pregnancy, unspecified trimester: Principal | ICD-10-CM | POA: Diagnosis present

## 2022-11-27 DIAGNOSIS — O9952 Diseases of the respiratory system complicating childbirth: Secondary | ICD-10-CM | POA: Diagnosis present

## 2022-11-27 LAB — COMPREHENSIVE METABOLIC PANEL
ALT: 14 U/L (ref 0–44)
AST: 21 U/L (ref 15–41)
Albumin: 3 g/dL — ABNORMAL LOW (ref 3.5–5.0)
Alkaline Phosphatase: 83 U/L (ref 38–126)
Anion gap: 12 (ref 5–15)
BUN: 8 mg/dL (ref 6–20)
CO2: 18 mmol/L — ABNORMAL LOW (ref 22–32)
Calcium: 9.3 mg/dL (ref 8.9–10.3)
Chloride: 105 mmol/L (ref 98–111)
Creatinine, Ser: 0.63 mg/dL (ref 0.44–1.00)
GFR, Estimated: >60 mL/min (ref >60)
Glucose, Bld: 88 mg/dL (ref 70–99)
Potassium: 3.9 mmol/L (ref 3.5–5.1)
Sodium: 135 mmol/L (ref 135–145)
Total Bilirubin: 0.8 mg/dL (ref 0.3–1.2)
Total Protein: 7.3 g/dL (ref 6.5–8.1)

## 2022-11-27 LAB — TYPE AND SCREEN
ABO/RH(D): A POS
Antibody Screen: NEGATIVE

## 2022-11-27 LAB — PROTEIN / CREATININE RATIO, URINE
Creatinine, Urine: 194 mg/dL
Protein Creatinine Ratio: 0.1 mg/mg{Cre} (ref 0.00–0.15)
Total Protein, Urine: 19 mg/dL

## 2022-11-27 LAB — CBC
HCT: 38.2 % (ref 36.0–46.0)
Hemoglobin: 12.5 g/dL (ref 12.0–15.0)
MCH: 28.6 pg (ref 26.0–34.0)
MCHC: 32.7 g/dL (ref 30.0–36.0)
MCV: 87.4 fL (ref 80.0–100.0)
Platelets: 181 10*3/uL (ref 150–400)
RBC: 4.37 MIL/uL (ref 3.87–5.11)
RDW: 14.6 % (ref 11.5–15.5)
WBC: 6.8 10*3/uL (ref 4.0–10.5)
nRBC: 0 % (ref 0.0–0.2)

## 2022-11-27 LAB — GLUCOSE, CAPILLARY: Glucose-Capillary: 80 mg/dL (ref 70–99)

## 2022-11-27 MED ORDER — DEXTROSE IN LACTATED RINGERS 5 % IV SOLN: INTRAVENOUS | Status: DC

## 2022-11-27 MED ORDER — OXYTOCIN-SODIUM CHLORIDE 30-0.9 UT/500ML-% IV SOLN: 2.5000 [IU]/h | INTRAVENOUS | Status: DC

## 2022-11-27 MED ORDER — MISOPROSTOL 25 MCG QUARTER TABLET
25.0000 ug | ORAL_TABLET | ORAL | Status: DC | PRN
  Administered 2022-11-27 (×2): 25 ug via VAGINAL
  Filled 2022-11-27 (×2): qty 1

## 2022-11-27 MED ORDER — INSULIN REGULAR(HUMAN) IN NACL 100-0.9 UT/100ML-% IV SOLN
INTRAVENOUS | Status: DC
  Administered 2022-11-27: 4 [IU]/h via INTRAVENOUS
  Administered 2022-11-27: 0.9 [IU]/h via INTRAVENOUS
  Filled 2022-11-27: qty 100

## 2022-11-27 MED ORDER — SOD CITRATE-CITRIC ACID 500-334 MG/5ML PO SOLN: 30.0000 mL | ORAL | Status: DC | PRN

## 2022-11-27 MED ORDER — DEXTROSE 50 % IV SOLN
0.0000 mL | INTRAVENOUS | Status: DC | PRN
  Administered 2022-11-28: 30 mL via INTRAVENOUS
  Filled 2022-11-27: qty 50

## 2022-11-27 MED ORDER — PENICILLIN G POT IN DEXTROSE 60000 UNIT/ML IV SOLN: 3.0000 10*6.[IU] | INTRAVENOUS | Status: DC

## 2022-11-27 MED ORDER — ACETAMINOPHEN 325 MG PO TABS: 650.0000 mg | ORAL_TABLET | ORAL | Status: DC | PRN

## 2022-11-27 MED ORDER — OXYTOCIN-SODIUM CHLORIDE 30-0.9 UT/500ML-% IV SOLN
1.0000 m[IU]/min | INTRAVENOUS | Status: DC
  Filled 2022-11-27: qty 500

## 2022-11-27 MED ORDER — LACTATED RINGERS IV SOLN: INTRAVENOUS | Status: DC

## 2022-11-27 MED ORDER — MISOPROSTOL 25 MCG QUARTER TABLET
25.0000 ug | ORAL_TABLET | Freq: Once | ORAL | Status: AC
  Administered 2022-11-27: 25 ug via VAGINAL
  Filled 2022-11-27: qty 1

## 2022-11-27 MED ORDER — INSULIN REGULAR(HUMAN) IN NACL 100-0.9 UT/100ML-% IV SOLN
INTRAVENOUS | Status: DC
Start: 2022-11-27 — End: 2022-11-27

## 2022-11-27 MED ORDER — FLEET ENEMA 7-19 GM/118ML RE ENEM: 1.0000 | ENEMA | RECTAL | Status: DC | PRN

## 2022-11-27 MED ORDER — ONDANSETRON HCL 4 MG/2ML IJ SOLN: 4.0000 mg | Freq: Four times a day (QID) | INTRAMUSCULAR | Status: DC | PRN

## 2022-11-27 MED ORDER — LIDOCAINE HCL (PF) 1 % IJ SOLN: 30.0000 mL | INTRAMUSCULAR | Status: DC | PRN

## 2022-11-27 MED ORDER — PENICILLIN G POT IN DEXTROSE 60000 UNIT/ML IV SOLN
3.0000 10*6.[IU] | INTRAVENOUS | Status: DC
  Administered 2022-11-27 – 2022-11-28 (×3): 3 10*6.[IU] via INTRAVENOUS
  Filled 2022-11-27 (×5): qty 50

## 2022-11-27 MED ORDER — LACTATED RINGERS IV SOLN
500.0000 mL | INTRAVENOUS | Status: DC | PRN
  Administered 2022-11-28: 500 mL via INTRAVENOUS

## 2022-11-27 MED ORDER — LACTATED RINGERS IV SOLN
INTRAVENOUS | Status: DC
Start: 2022-11-27 — End: 2022-11-27

## 2022-11-27 MED ORDER — SODIUM CHLORIDE 0.9 % IV SOLN
5.0000 10*6.[IU] | Freq: Once | INTRAVENOUS | Status: AC
  Administered 2022-11-27: 5 10*6.[IU] via INTRAVENOUS
  Filled 2022-11-27: qty 5

## 2022-11-27 MED ORDER — DEXTROSE 50 % IV SOLN
0.0000 mL | INTRAVENOUS | Status: DC | PRN
Start: 2022-11-27 — End: 2022-11-27

## 2022-11-27 MED ORDER — MISOPROSTOL 50MCG HALF TABLET
50.0000 ug | ORAL_TABLET | Freq: Once | ORAL | Status: AC
  Administered 2022-11-27: 50 ug via BUCCAL
  Filled 2022-11-27: qty 1

## 2022-11-27 MED ORDER — ZOLPIDEM TARTRATE 5 MG PO TABS
5.0000 mg | ORAL_TABLET | Freq: Every evening | ORAL | Status: DC | PRN
  Administered 2022-11-27: 5 mg via ORAL
  Filled 2022-11-27: qty 1

## 2022-11-27 MED ORDER — SODIUM CHLORIDE 0.9 % IV SOLN
5.0000 10*6.[IU] | Freq: Once | INTRAVENOUS | Status: DC
  Filled 2022-11-27: qty 5

## 2022-11-27 MED ORDER — OXYTOCIN BOLUS FROM INFUSION
333.0000 mL | Freq: Once | INTRAVENOUS | Status: AC
  Administered 2022-11-28: 333 mL via INTRAVENOUS

## 2022-11-27 MED ORDER — VALACYCLOVIR HCL 500 MG PO TABS
500.0000 mg | ORAL_TABLET | Freq: Two times a day (BID) | ORAL | Status: DC
  Administered 2022-11-28: 500 mg via ORAL
  Filled 2022-11-27: qty 1

## 2022-11-27 MED ORDER — TERBUTALINE SULFATE 1 MG/ML IJ SOLN: 0.2500 mg | Freq: Once | INTRAMUSCULAR | Status: DC | PRN

## 2022-11-27 MED ORDER — LACTATED RINGERS IV SOLN
INTRAVENOUS | Status: DC
  Administered 2022-11-27: 125 mL/h via INTRAVENOUS

## 2022-11-27 MED ORDER — FENTANYL CITRATE (PF) 100 MCG/2ML IJ SOLN
100.0000 ug | INTRAMUSCULAR | Status: DC | PRN
  Administered 2022-11-27 – 2022-11-28 (×3): 100 ug via INTRAVENOUS
  Filled 2022-11-27 (×3): qty 2

## 2022-11-27 NOTE — Progress Notes (Addendum)
Kylie Gamble is a 43 y.o. [id number] at [redacted]w[redacted]d admitted for poorly controlled Type 2 DM.  Subjective: Patient feels occasional contractions.   Objective: BP 124/70   Pulse 90   Temp 98.5 F (36.9 C) (Oral)   Resp 16   Ht 5\' 2"  (1.575 m)   Wt 118.8 kg   LMP 03/13/2022   BMI 47.92 kg/m  No intake/output data recorded. No intake/output data recorded.    11/27/2022    5:50 PM 11/27/2022    4:30 PM 11/27/2022    3:32 PM  Vitals with BMI  Systolic 124 119 147  Diastolic 70 64 55  Pulse 90 84 89   FHT:  FHR: 120 bpm, variability: moderate,  accelerations:  Present,  decelerations:  Absent UC:   irregular, every 3 to 6 minutes SVE:   Dilation: 1 Effacement (%): 50 Station: -3 Exam by:: [employer]  Labs: Lab Results  Component Value Date   WBC 6.8 11/27/2022   HGB 12.5 11/27/2022   HCT 38.2 11/27/2022   MCV 87.4 11/27/2022   PLT 181 11/27/2022   Recent Results (from the past 2160 hour(s))  OB RESULT CONSOLE Group B Strep     Status: None   Collection Time: 11/13/22 12:00 AM  Result Value Ref Range   GBS Positive   CBC     Status: None   Collection Time: 11/27/22 11:56 AM  Result Value Ref Range   WBC 6.8 4.0 - 10.5 K/uL   RBC 4.37 3.87 - 5.11 MIL/uL   Hemoglobin 12.5 12.0 - 15.0 g/dL   HCT 82.9 56.2 - 13.0 %   MCV 87.4 80.0 - 100.0 fL   MCH 28.6 26.0 - 34.0 pg   MCHC 32.7 30.0 - 36.0 g/dL   RDW 86.5 78.4 - 69.6 %   Platelets 181 150 - 400 K/uL   nRBC 0.0 0.0 - 0.2 %    Comment: Performed at [facility], [address] [place], [place] [address]  Type and screen [facility]     Status: None   Collection Time: 11/27/22 11:56 AM  Result Value Ref Range   ABO/RH(D) A POS    Antibody Screen NEG    Sample Expiration      11/30/2022,2359 Performed at [facility], [address] [place], [place] [address]   Comprehensive metabolic panel     Status: Abnormal   Collection Time: 11/27/22 11:56 AM  Result Value Ref Range    Sodium 135 135 - 145 mmol/L   Potassium 3.9 3.5 - 5.1 mmol/L   Chloride 105 98 - 111 mmol/L   CO2 18 (L) 22 - 32 mmol/L   Glucose, Bld 88 70 - 99 mg/dL    Comment: Glucose reference range applies only to samples taken after fasting for at least 8 hours.   BUN 8 6 - 20 mg/dL   Creatinine, Ser 4.01 0.44 - 1.00 mg/dL   Calcium 9.3 8.9 - 02.7 mg/dL   Total Protein 7.3 6.5 - 8.1 g/dL   Albumin 3.0 (L) 3.5 - 5.0 g/dL   AST 21 15 - 41 U/L   ALT 14 0 - 44 U/L   Alkaline Phosphatase 83 38 - 126 U/L   Total Bilirubin 0.8 0.3 - 1.2 mg/dL   GFR, Estimated >25 >36 mL/min    Comment: (NOTE) Calculated using the CKD-EPI Creatinine Equation (2021)    Anion gap 12 5 - 15    Comment: Performed at  [facility], [address], [place], [place] [address]  Protein / creatinine ratio, urine     Status: None   Collection Time: 11/27/22 11:56 AM  Result Value Ref Range   Creatinine, Urine 194 mg/dL   Total Protein, Urine 19 mg/dL    Comment: NO NORMAL RANGE ESTABLISHED FOR THIS TEST   Protein Creatinine Ratio 0.10 0.00 - 0.15 mg/mg[Cre]    Comment: Performed at [facility], [address], [place], [place] [address]  Glucose, capillary     Status: None   Collection Time: 11/27/22  1:02 PM  Result Value Ref Range   Glucose-Capillary 80 70 - 99 mg/dL    Comment: Glucose reference range applies only to samples taken after fasting for at least 8 hours.    Assessment / Plan: 42 y.o. [id number] at [redacted]w[redacted]d admitted for poorly controlled Type 2 DM,  Labor:  s/p 1 set of buccal and vaginal cytotec, then 1 vaginal cytotec. Preeclampsia:   None.  Fetal Wellbeing:  Category I Pain Control:  IV pain meds or nitrous oxide as desired.  I/D:   GBS positive on penicillin.  Anticipated MOD:  NSVD  Kylie Sours, MD 11/27/2022.

## 2022-11-27 NOTE — H&P (Addendum)
Kylie Gamble is a 42 y.o. female presenting for induction of labor for poorly controlled Type 2 DM on insulin.  [id number] at [redacted] weeks EGA, LMP 03/13/22 EDC 12/17/21 by LMP consistent with second trimester U/S.  She feels mild contractions. She denies vaginal bleeding or leakage of fluid. With normal fetal movement. She denies any HSV symptoms.   Prenatal care received at [facility].  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  AMA, had low risk panorama. History of LEEP procedure before pregnancy. Large for gestational age, last ultrasound 11/20/22: EFW 7lbs 13 oz (98%), AFI 18, AC 99%, BPP 8/8. GBS positive with clindamycin resistance.  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  Polyhydramnios at 34 weeks, resolved as per last U/S.  Current BMI of 47.  OB History     Gravida  4   Para  2   Term  2   Preterm      AB  1   Living  2      SAB      IAB  1   Ectopic      Multiple      Live Births             Past Medical History:  Diagnosis Date   Asthma    Diabetes mellitus without complication (HCC)    Endometriosis    Fibroid    HSV (herpes simplex virus) infection    Vaginal Pap smear, abnormal    Past Surgical History:  Procedure Laterality Date   COLPOSCOPY     DILATION AND CURETTAGE OF UTERUS     LEEP     Family History: family history includes Asthma in her father and maternal grandfather; Cancer in her maternal aunt, maternal grandfather, and maternal grandmother; Diabetes in her maternal grandfather; Heart disease in her father; Hypertension in her maternal grandmother. Social History:  reports that she has never smoked. She has never used smokeless tobacco. She reports that she does not currently use alcohol. She reports that she does not use drugs.     Maternal Diabetes: Yes:  Diabetes Type:  Pre-pregnancy, Insulin/Medication  controlled Genetic Screening: Normal Maternal Ultrasounds/Referrals: Other: As above. Fetal Ultrasounds or other Referrals:  Fetal echo: Normal.  Maternal Substance Abuse:  No Significant Maternal Medications:  Meds include: Other:  Significant Maternal Lab Results:  Group B Strep positive Number of Prenatal Visits:greater than 3 verified prenatal visits Other Comments:  None  Review of Systems Constitutional: Denies fevers/chills Cardiovascular: Denies chest pain or palpitations Pulmonary: Denies coughing or wheezing Gastrointestinal: Denies nausea, vomiting or diarrhea Genitourinary: Denies pelvic pain, unusual vaginal bleeding, unusual vaginal discharge, dysuria, urgency or frequency.  Musculoskeletal: Denies muscle or joint aches and pain.  Neurology: Denies abnormal sensations such as tingling or numbness.   History Dilation: 1 Effacement (%): 50 Exam by:: Dr. Sallye Ober Blood pressure 108/61, pulse 74, temperature 98.5 F (36.9 C), temperature source Oral, resp. rate 16, height 5\' 2"  (1.575 m), weight 118.8 kg, last menstrual period 03/13/2022. Exam Physical Exam  Constitutional: She is oriented to person, place, and time. She appears well-developed and well-nourished.  HENT:  Head: Normocephalic and atraumatic.  Neck: Normal range of motion.  Cardiovascular: Normal rate.    Respiratory: Effort normal.   GI: Soft.  Skin: Skin is warm and dry.  Psychiatric: She has a normal mood and affect. Her behavior is  normal.   Genitourinary: Gravid uterus, EFW by leopolds 9 lbs.  Bedside ultrasound: Cephalic.  Speculum exam: No vulval-vaginal- cervical lesions.  Prenatal labs: ABO, Rh: --/--/A POS (06/20 1156) Antibody: NEG (06/20 1156) Rubella: Immune (01/23 0000) RPR: Nonreactive (01/23 0000)  HBsAg: Negative (01/23 0000)  HIV: Non-reactive (01/23 0000)  GBS: Positive/-- (06/06 0000)   Current Outpatient Medications  Medication Instructions   aspirin EC 81 mg, Oral, Daily,  Swallow whole.   insulin glargine (LANTUS) 36 Units, Subcutaneous, Daily at bedtime   insulin lispro (HUMALOG) 40 Units, Subcutaneous, 3 times daily before meals, 40 units with breakfast<BR>30 units at lunch<BR>30 units at dinner   Prenatal Vit-Fe Fumarate-FA (PRENATAL VITAMINS PO) Oral    Allergies  Allergen Reactions   Metformin And Related Diarrhea and Nausea And Vomiting    Assessment/Plan: 42 y/o [id number] at [redacted] weeks EGA here for induction of labor for poorly controlled Pregestational DM on insulin, - Admit to Labor and Delivery as per admit orders. - I discussed with patient risks, benefits and alternatives of labor induction including risks of cesarean delivery.  We discussed risks of induction agents including effects on fetal heart beat, contraction pattern and need for close monitoring.  Patient expressed understanding of all this and desired to proceed with the induction.  Patient with a history of 8lbs 15 oz baby and 3rd degree laceration.  She declines a cesarean delivery at this point and desires an attempt of vaginal delivery.   - Penicillin for GBS prophylaxis.  - Start with buccal and vaginal cytotec for induction.  -Preeclampsia labs done.  - Patient with Dexacom monitor and will check sugars Q 4 hrs and use endotool for sugars management.  [name], MD. 11/27/2022, 1:08 PM

## 2022-11-28 ENCOUNTER — Inpatient Hospital Stay (HOSPITAL_COMMUNITY): Payer: PRIVATE HEALTH INSURANCE | Admitting: Anesthesiology

## 2022-11-28 ENCOUNTER — Encounter (HOSPITAL_COMMUNITY): Payer: Self-pay | Admitting: Obstetrics & Gynecology

## 2022-11-28 LAB — GLUCOSE, CAPILLARY: Glucose-Capillary: 161 mg/dL — ABNORMAL HIGH (ref 70–99)

## 2022-11-28 LAB — RPR: RPR Ser Ql: NONREACTIVE

## 2022-11-28 MED ORDER — TETANUS-DIPHTH-ACELL PERTUSSIS 5-2.5-18.5 LF-MCG/0.5 IM SUSY: 0.5000 mL | PREFILLED_SYRINGE | Freq: Once | INTRAMUSCULAR | Status: DC

## 2022-11-28 MED ORDER — PHENYLEPHRINE 80 MCG/ML (10ML) SYRINGE FOR IV PUSH (FOR BLOOD PRESSURE SUPPORT)
80.0000 ug | PREFILLED_SYRINGE | INTRAVENOUS | Status: DC | PRN
  Administered 2022-11-28 (×2): 80 ug via INTRAVENOUS

## 2022-11-28 MED ORDER — LACTATED RINGERS IV SOLN: 500.0000 mL | Freq: Once | INTRAVENOUS | Status: DC

## 2022-11-28 MED ORDER — ONDANSETRON HCL 4 MG/2ML IJ SOLN: 4.0000 mg | INTRAMUSCULAR | Status: DC | PRN

## 2022-11-28 MED ORDER — PRENATAL MULTIVITAMIN CH
1.0000 | ORAL_TABLET | Freq: Every day | ORAL | Status: DC
  Administered 2022-11-28 – 2022-11-30 (×3): 1 via ORAL
  Filled 2022-11-28 (×3): qty 1

## 2022-11-28 MED ORDER — OXYTOCIN-SODIUM CHLORIDE 30-0.9 UT/500ML-% IV SOLN
1.0000 m[IU]/min | INTRAVENOUS | Status: DC
  Administered 2022-11-28: 1 m[IU]/min via INTRAVENOUS

## 2022-11-28 MED ORDER — OXYTOCIN-SODIUM CHLORIDE 30-0.9 UT/500ML-% IV SOLN: 1.0000 m[IU]/min | INTRAVENOUS | Status: DC

## 2022-11-28 MED ORDER — INSULIN GLARGINE-YFGN 100 UNIT/ML ~~LOC~~ SOLN: 36.0000 [IU] | Freq: Every day | SUBCUTANEOUS | Status: DC

## 2022-11-28 MED ORDER — SENNOSIDES-DOCUSATE SODIUM 8.6-50 MG PO TABS
2.0000 | ORAL_TABLET | ORAL | Status: DC
  Administered 2022-11-29 – 2022-11-30 (×2): 2 via ORAL
  Filled 2022-11-28 (×2): qty 2

## 2022-11-28 MED ORDER — EPHEDRINE 5 MG/ML INJ: 10.0000 mg | INTRAVENOUS | Status: DC | PRN

## 2022-11-28 MED ORDER — IBUPROFEN 600 MG PO TABS
600.0000 mg | ORAL_TABLET | Freq: Four times a day (QID) | ORAL | Status: DC
  Administered 2022-11-28 – 2022-11-30 (×8): 600 mg via ORAL
  Filled 2022-11-28 (×9): qty 1

## 2022-11-28 MED ORDER — INSULIN GLARGINE-YFGN 100 UNIT/ML ~~LOC~~ SOLN
12.0000 [IU] | Freq: Every day | SUBCUTANEOUS | Status: DC
  Administered 2022-11-29 – 2022-11-30 (×2): 12 [IU] via SUBCUTANEOUS
  Filled 2022-11-28 (×2): qty 0.12

## 2022-11-28 MED ORDER — DIPHENHYDRAMINE HCL 25 MG PO CAPS: 25.0000 mg | ORAL_CAPSULE | Freq: Four times a day (QID) | ORAL | Status: DC | PRN

## 2022-11-28 MED ORDER — WITCH HAZEL-GLYCERIN EX PADS: 1.0000 | MEDICATED_PAD | CUTANEOUS | Status: DC | PRN

## 2022-11-28 MED ORDER — PHENYLEPHRINE 80 MCG/ML (10ML) SYRINGE FOR IV PUSH (FOR BLOOD PRESSURE SUPPORT)
80.0000 ug | PREFILLED_SYRINGE | INTRAVENOUS | Status: DC | PRN
  Filled 2022-11-28: qty 10

## 2022-11-28 MED ORDER — INSULIN GLARGINE-YFGN 100 UNIT/ML ~~LOC~~ SOLN
12.0000 [IU] | Freq: Two times a day (BID) | SUBCUTANEOUS | Status: DC
  Filled 2022-11-28: qty 0.12

## 2022-11-28 MED ORDER — ONDANSETRON HCL 4 MG PO TABS: 4.0000 mg | ORAL_TABLET | ORAL | Status: DC | PRN

## 2022-11-28 MED ORDER — DIBUCAINE (PERIANAL) 1 % EX OINT: 1.0000 | TOPICAL_OINTMENT | CUTANEOUS | Status: DC | PRN

## 2022-11-28 MED ORDER — INSULIN GLARGINE-YFGN 100 UNIT/ML ~~LOC~~ SOLN
12.0000 [IU] | Freq: Once | SUBCUTANEOUS | Status: AC
  Administered 2022-11-28: 12 [IU] via SUBCUTANEOUS
  Filled 2022-11-28: qty 0.12

## 2022-11-28 MED ORDER — BENZOCAINE-MENTHOL 20-0.5 % EX AERO
1.0000 | INHALATION_SPRAY | CUTANEOUS | Status: DC | PRN
  Administered 2022-11-28: 1 via TOPICAL
  Filled 2022-11-28: qty 56

## 2022-11-28 MED ORDER — DIPHENHYDRAMINE HCL 50 MG/ML IJ SOLN: 12.5000 mg | INTRAMUSCULAR | Status: DC | PRN

## 2022-11-28 MED ORDER — FENTANYL-BUPIVACAINE-NACL 0.5-0.125-0.9 MG/250ML-% EP SOLN
12.0000 mL/h | EPIDURAL | Status: DC | PRN
  Administered 2022-11-28: 12 mL/h via EPIDURAL
  Filled 2022-11-28: qty 250

## 2022-11-28 MED ORDER — COCONUT OIL OIL
1.0000 | TOPICAL_OIL | Status: DC | PRN
  Administered 2022-11-30: 1 via TOPICAL

## 2022-11-28 MED ORDER — INSULIN ASPART 100 UNIT/ML IJ SOLN
0.0000 [IU] | Freq: Every day | INTRAMUSCULAR | Status: DC
  Administered 2022-11-28: 2 [IU] via SUBCUTANEOUS

## 2022-11-28 MED ORDER — ACETAMINOPHEN 325 MG PO TABS: 650.0000 mg | ORAL_TABLET | ORAL | Status: DC | PRN

## 2022-11-28 MED ORDER — SIMETHICONE 80 MG PO CHEW: 80.0000 mg | CHEWABLE_TABLET | ORAL | Status: DC | PRN

## 2022-11-28 MED ORDER — FUROSEMIDE 40 MG PO TABS
40.0000 mg | ORAL_TABLET | Freq: Every day | ORAL | Status: DC
  Administered 2022-11-28 – 2022-11-29 (×2): 40 mg via ORAL
  Filled 2022-11-28 (×2): qty 1

## 2022-11-28 MED ORDER — INSULIN ASPART 100 UNIT/ML IJ SOLN
0.0000 [IU] | Freq: Three times a day (TID) | INTRAMUSCULAR | Status: DC
  Administered 2022-11-28 (×2): 3 [IU] via SUBCUTANEOUS
  Administered 2022-11-29: 4 [IU] via SUBCUTANEOUS
  Administered 2022-11-30: 3 [IU] via SUBCUTANEOUS

## 2022-11-28 MED ORDER — OXYCODONE-ACETAMINOPHEN 5-325 MG PO TABS
1.0000 | ORAL_TABLET | ORAL | Status: DC | PRN
  Administered 2022-11-29: 1 via ORAL
  Filled 2022-11-28: qty 1

## 2022-11-28 MED ORDER — INSULIN ASPART 100 UNIT/ML IJ SOLN: 30.0000 [IU] | Freq: Three times a day (TID) | INTRAMUSCULAR | Status: DC

## 2022-11-28 MED ORDER — LIDOCAINE-EPINEPHRINE (PF) 1.5 %-1:200000 IJ SOLN
INTRAMUSCULAR | Status: DC | PRN
  Administered 2022-11-28: 5 mL via EPIDURAL

## 2022-11-28 MED ORDER — CYCLOBENZAPRINE HCL 10 MG PO TABS
5.0000 mg | ORAL_TABLET | Freq: Two times a day (BID) | ORAL | Status: DC | PRN
  Administered 2022-11-28: 5 mg via ORAL
  Filled 2022-11-28: qty 1

## 2022-11-28 NOTE — Inpatient Diabetes Management (Signed)
Inpatient Diabetes Program Recommendations  AACE/ADA: New Consensus Statement on Inpatient Glycemic Control (2015)  Target Ranges:  Prepandial:   less than 140 mg/dL      Peak postprandial:   less than 180 mg/dL (1-2 hours)      Critically ill patients:  140 - 180 mg/dL   Lab Results  Component Value Date   GLUCAP 80 11/27/2022   Diabetes history: Type 2 DM Outpatient Diabetes medications: Lantus 36 units at bedtime, Humalog 40/30/30 Current orders for Inpatient glycemic control: IV insulin  Inpatient Diabetes Program Recommendations:    Now that patient delivered, consider adding Semglee 12 units every day and Novolog 0-9 units TID & HS  Thanks, Lujean Rave, MSN, RNC-OB Diabetes Coordinator [phone] (8a-5p)

## 2022-11-28 NOTE — Anesthesia Procedure Notes (Signed)
Epidural Patient location during procedure: OB Start time: 11/28/2022 2:12 AM End time: 11/28/2022 2:17 AM  Staffing Anesthesiologist: Atilano Median, DO Performed: anesthesiologist   Preanesthetic Checklist Completed: patient identified, IV checked, site marked, risks and benefits discussed, surgical consent, monitors and equipment checked, pre-op evaluation and timeout performed  Epidural Patient position: sitting Prep: ChloraPrep Patient monitoring: heart rate, continuous pulse ox and blood pressure Approach: midline Location: L3-L4 Injection technique: LOR saline  Needle:  Needle type: Tuohy  Needle gauge: 17 G Needle length: 9 cm Needle insertion depth: 8 cm Catheter type: closed end flexible Catheter size: 20 Guage Catheter at skin depth: 14 cm Test dose: negative and 1.5% lidocaine with Epi 1:200 K  Assessment Events: blood not aspirated, no cerebrospinal fluid, injection not painful, no injection resistance and no paresthesia  Additional Notes Patient identified. Risks/Benefits/Options discussed with patient including but not limited to bleeding, infection, nerve damage, paralysis, failed block, incomplete pain control, headache, blood pressure changes, nausea, vomiting, reactions to medications, itching and postpartum back pain. Confirmed with bedside nurse the patient's most recent platelet count. Confirmed with patient that they are not currently taking any anticoagulation, have any bleeding history or any family history of bleeding disorders. Patient expressed understanding and wished to proceed. All questions were answered. Sterile technique was used throughout the entire procedure. Please see nursing notes for vital signs. Test dose was given through epidural catheter and negative prior to continuing to dose epidural or start infusion. Warning signs of high block given to the patient including shortness of breath, tingling/numbness in hands, complete motor block,  or any concerning symptoms with instructions to call for help. Patient was given instructions on fall risk and not to get out of bed. All questions and concerns addressed with instructions to call with any issues or inadequate analgesia.    Reason for block:procedure for pain

## 2022-11-28 NOTE — Anesthesia Preprocedure Evaluation (Signed)
Anesthesia Evaluation  Patient identified by MRN, date of birth, ID band Patient awake    Reviewed: Allergy & Precautions, NPO status , Patient's Chart, lab work & pertinent test results  Airway Mallampati: III  TM Distance: >3 FB Neck ROM: Full    Dental no notable dental hx.    Pulmonary asthma    Pulmonary exam normal        Cardiovascular negative cardio ROS  Rhythm:Regular Rate:Normal     Neuro/Psych negative neurological ROS  negative psych ROS   GI/Hepatic negative GI ROS, Neg liver ROS,,,  Endo/Other  diabetes, Type 2, Insulin Dependent  Morbid obesity  Renal/GU negative Renal ROS  negative genitourinary   Musculoskeletal negative musculoskeletal ROS (+)    Abdominal  (+) + obese  Peds  Hematology negative hematology ROS (+)   Anesthesia Other Findings   Reproductive/Obstetrics (+) Pregnancy                             Anesthesia Physical Anesthesia Plan  ASA: 3  Anesthesia Plan: Epidural   Post-op Pain Management:    Induction:   PONV Risk Score and Plan: 2 and Treatment may vary due to age or medical condition  Airway Management Planned: Simple Face Mask and Nasal Cannula  Additional Equipment: None  Intra-op Plan:   Post-operative Plan:   Informed Consent: I have reviewed the patients History and Physical, chart, labs and discussed the procedure including the risks, benefits and alternatives for the proposed anesthesia with the patient or authorized representative who has indicated his/her understanding and acceptance.     Dental advisory given  Plan Discussed with:   Anesthesia Plan Comments:        Anesthesia Quick Evaluation

## 2022-11-28 NOTE — Lactation Note (Signed)
This note was copied from a baby's chart. Lactation Consultation Note  Patient Name: Kylie Gamble Date: 11/28/2022 Age:44 hours   LC spoke with Mom and Mom is choosing to formula feed baby.  Lactation services not needed.   Judee Clara 11/28/2022, 2:54 PM

## 2022-11-28 NOTE — Progress Notes (Signed)
Kylie Gamble is a 42 y.o. female at 37 weeks and 1 day admitted for induction due to poorly controlled Type 2 diabetes.  She has received 3 doses of cytotec last dose was at 2041. Fetal tracing reviewed remotely  FHR: Baseline 125 moderate variability Accelerations present no decelerations.  TOCO: Ctx every 2-4 minutes At 2035 cx was 2/80/-3 Will continue to monitor.

## 2022-11-29 ENCOUNTER — Inpatient Hospital Stay (HOSPITAL_COMMUNITY): Payer: PRIVATE HEALTH INSURANCE

## 2022-11-29 LAB — CBC
HCT: 31.7 % — ABNORMAL LOW (ref 36.0–46.0)
Hemoglobin: 10.1 g/dL — ABNORMAL LOW (ref 12.0–15.0)
MCH: 28 pg (ref 26.0–34.0)
MCHC: 31.9 g/dL (ref 30.0–36.0)
MCV: 87.8 fL (ref 80.0–100.0)
Platelets: 155 10*3/uL (ref 150–400)
RBC: 3.61 MIL/uL — ABNORMAL LOW (ref 3.87–5.11)
RDW: 14.9 % (ref 11.5–15.5)
WBC: 10.4 10*3/uL (ref 4.0–10.5)
nRBC: 0 % (ref 0.0–0.2)

## 2022-11-29 LAB — GLUCOSE, CAPILLARY: Glucose-Capillary: 98 mg/dL (ref 70–99)

## 2022-11-29 LAB — BIRTH TISSUE RECOVERY COLLECTION (PLACENTA DONATION)

## 2022-11-29 NOTE — Progress Notes (Signed)
Post Partum Day 1 Subjective: no complaints, up ad lib, voiding, tolerating PO, and + flatus, LE edema much improved with Lasix. Patient desires circumcision for female infant. Infant went to NICU early this morning for respiration. Fasting CBG 99, PP 130, 213, 118 overnight.   Objective: Blood pressure 139/74, pulse 76, temperature 98.4 F (36.9 C), temperature source Oral, resp. rate 18, height 5\' 2"  (1.575 m), weight 118.8 kg, last menstrual period 03/13/2022, SpO2 99 %, unknown if currently breastfeeding.  Physical Exam:  General: alert, cooperative, and no distress Lochia: appropriate Uterine Fundus: firm Incision: n/a DVT Evaluation: No evidence of DVT seen on physical exam. Calf/Ankle edema is present.  Recent Labs    11/27/22 1156 11/29/22 0519  HGB 12.5 10.1*  HCT 38.2 31.7*    Assessment/Plan: PDD#1 s/p VD Pre-existing Diabetes Melitus Type 2 Lymphedema  Plan for discharge tomorrow and Circumcision prior to discharge, bottle feeding as infant in NICU Continue Semglee 12U qhc and sliding scalde Novolog Continue Lasix 40mg  every day PRN   LOS: 2 days   Jackie Plum, MD 11/29/2022, 10:52 AM

## 2022-11-29 NOTE — Anesthesia Postprocedure Evaluation (Signed)
Anesthesia Post Note  Patient: Kylie Gamble  Procedure(s) Performed: AN AD HOC LABOR EPIDURAL     Patient location during evaluation: Mother Baby Anesthesia Type: Epidural Level of consciousness: awake and alert Pain management: pain level controlled Vital Signs Assessment: post-procedure vital signs reviewed and stable Respiratory status: spontaneous breathing, nonlabored ventilation and respiratory function stable Cardiovascular status: stable Postop Assessment: no headache, no backache, epidural receding and able to ambulate Anesthetic complications: no   No notable events documented.  Last Vitals:  Vitals:   11/29/22 0531 11/29/22 0831  BP: (!) 114/59 139/74  Pulse: 86 76  Resp: 17 18  Temp: 36.4 C 36.9 C  SpO2: 97% 99%    Last Pain:  Vitals:   11/29/22 0845  TempSrc:   PainSc: 0-No pain   Pain Goal: Patients Stated Pain Goal: 2 (11/28/22 2327)                 Rayhan Groleau

## 2022-11-29 NOTE — Plan of Care (Signed)
Problem: Education: Goal: Knowledge of General Education information will improve Description: Including pain rating scale, medication(s)/side effects and non-pharmacologic comfort measures Outcome: Progressing   Problem: Health Behavior/Discharge Planning: Goal: Ability to manage health-related needs will improve Outcome: Progressing   Problem: Clinical Measurements: Goal: Ability to maintain clinical measurements within normal limits will improve Outcome: Progressing Goal: Will remain free from infection Outcome: Progressing Goal: Diagnostic test results will improve Outcome: Progressing Goal: Respiratory complications will improve Outcome: Progressing Goal: Cardiovascular complication will be avoided Outcome: Progressing   Problem: Activity: Goal: Risk for activity intolerance will decrease Outcome: Progressing   Problem: Nutrition: Goal: Adequate nutrition will be maintained Outcome: Progressing   Problem: Coping: Goal: Level of anxiety will decrease Outcome: Progressing   Problem: Elimination: Goal: Will not experience complications related to bowel motility Outcome: Progressing Goal: Will not experience complications related to urinary retention Outcome: Progressing   Problem: Pain Managment: Goal: General experience of comfort will improve Outcome: Progressing   Problem: Safety: Goal: Ability to remain free from injury will improve Outcome: Progressing   Problem: Skin Integrity: Goal: Risk for impaired skin integrity will decrease Outcome: Progressing   Problem: Education: Goal: Knowledge of Childbirth will improve Outcome: Progressing Goal: Ability to make informed decisions regarding treatment and plan of care will improve Outcome: Progressing Goal: Ability to state and carry out methods to decrease the pain will improve Outcome: Progressing Goal: Individualized Educational Video(s) Outcome: Progressing   Problem: Coping: Goal: Ability to  verbalize concerns and feelings about labor and delivery will improve Outcome: Progressing   Problem: Life Cycle: Goal: Ability to make normal progression through stages of labor will improve Outcome: Progressing Goal: Ability to effectively push during vaginal delivery will improve Outcome: Progressing   Problem: Role Relationship: Goal: Will demonstrate positive interactions with the child Outcome: Progressing   Problem: Safety: Goal: Risk of complications during labor and delivery will decrease Outcome: Progressing   Problem: Pain Management: Goal: Relief or control of pain from uterine contractions will improve Outcome: Progressing   Problem: Education: Goal: Ability to describe self-care measures that may prevent or decrease complications (Diabetes Survival Skills Education) will improve Outcome: Progressing Goal: Individualized Educational Video(s) Outcome: Progressing   Problem: Coping: Goal: Ability to adjust to condition or change in health will improve Outcome: Progressing   Problem: Fluid Volume: Goal: Ability to maintain a balanced intake and output will improve Outcome: Progressing   Problem: Health Behavior/Discharge Planning: Goal: Ability to identify and utilize available resources and services will improve Outcome: Progressing Goal: Ability to manage health-related needs will improve Outcome: Progressing   Problem: Metabolic: Goal: Ability to maintain appropriate glucose levels will improve Outcome: Progressing   Problem: Nutritional: Goal: Maintenance of adequate nutrition will improve Outcome: Progressing Goal: Progress toward achieving an optimal weight will improve Outcome: Progressing   Problem: Skin Integrity: Goal: Risk for impaired skin integrity will decrease Outcome: Progressing   Problem: Tissue Perfusion: Goal: Adequacy of tissue perfusion will improve Outcome: Progressing   Problem: Education: Goal: Knowledge of condition will  improve Outcome: Progressing Goal: Individualized Educational Video(s) Outcome: Progressing Goal: Individualized Newborn Educational Video(s) Outcome: Progressing   Problem: Activity: Goal: Will verbalize the importance of balancing activity with adequate rest periods Outcome: Progressing Goal: Ability to tolerate increased activity will improve Outcome: Progressing   Problem: Coping: Goal: Ability to identify and utilize available resources and services will improve Outcome: Progressing   Problem: Life Cycle: Goal: Chance of risk for complications during the postpartum period will decrease Outcome: Progressing     Problem: Role Relationship: Goal: Ability to demonstrate positive interaction with newborn will improve Outcome: Progressing   Problem: Skin Integrity: Goal: Demonstration of wound healing without infection will improve Outcome: Progressing   

## 2022-11-30 DIAGNOSIS — I890 Lymphedema, not elsewhere classified: Secondary | ICD-10-CM

## 2022-11-30 LAB — GLUCOSE, CAPILLARY: Glucose-Capillary: 79 mg/dL (ref 70–99)

## 2022-11-30 MED ORDER — INSULIN ASPART 100 UNIT/ML IJ SOLN: 0.0000 [IU] | Freq: Three times a day (TID) | INTRAMUSCULAR | 11 refills | Status: AC

## 2022-11-30 MED ORDER — IBUPROFEN 600 MG PO TABS: 600.0000 mg | ORAL_TABLET | Freq: Four times a day (QID) | ORAL | 0 refills | Status: AC

## 2022-11-30 MED ORDER — INSULIN ASPART 100 UNIT/ML IJ SOLN: 0.0000 [IU] | Freq: Every day | INTRAMUSCULAR | 11 refills | Status: AC

## 2022-11-30 MED ORDER — INSULIN GLARGINE-YFGN 100 UNIT/ML ~~LOC~~ SOLN: 12.0000 [IU] | Freq: Every day | SUBCUTANEOUS | 0 refills | Status: AC

## 2022-11-30 NOTE — Discharge Summary (Signed)
SVD OB Discharge Summary       Patient Name: Kylie Gamble DOB: 31-Jan-1981 MRN: [id number]  Date of admission: 11/27/2022 Delivering MD: Rhea Pink B  Date of delivery: 11/28/2022 Type of delivery: SVD  Newborn Data: Sex: Baby Female Circumcision: desires in pt circ but NB in NICU Live born female  Birth Weight: 7 lb 12.9 oz (3540 g) APGAR: 8, 8  Newborn Delivery   Birth date/time: 11/28/2022 08:34:00 Delivery type: Vaginal, Spontaneous      Feeding: breast Infant in NICU and will stay for 2 more days but pt  being discharge to home n stable condition.   Admitting diagnosis: Pregestational diabetes mellitus, modified White class B [O24.319] Intrauterine pregnancy: [redacted]w[redacted]d     Secondary diagnosis:  Principal Problem:   Pregestational diabetes mellitus, modified White class B Active Problems:   SVD (spontaneous vaginal delivery)   Lymphedema                                Complications: None                                                              Intrapartum Procedures: spontaneous vaginal delivery, GBS prophylactics.  Postpartum Procedures: none Complications-Operative and Postpartum: none Augmentation: AROM, Pitocin, and Cytotec   History of Present Illness: Ms. Kylie Gamble is a 42 y.o. female, [phone], who presents at [redacted]w[redacted]d weeks gestation. The patient has been followed at  [facility]  Her pregnancy has been complicated by:  Patient Active Problem List   Diagnosis Date Noted   SVD (spontaneous vaginal delivery) 11/30/2022   Lymphedema 11/30/2022   Pregestational diabetes mellitus, modified White class B 11/27/2022   Obesity affecting pregnancy 10/21/2022   Type 2 diabetes mellitus affecting pregnancy in third trimester, antepartum 10/21/2022   AMA (advanced maternal age) multigravida 35+ 10/21/2022     Active Ambulatory Problems    Diagnosis Date Noted   Obesity affecting pregnancy 10/21/2022   Type 2  diabetes mellitus affecting pregnancy in third trimester, antepartum 10/21/2022   AMA (advanced maternal age) multigravida 35+ 10/21/2022   Resolved Ambulatory Problems    Diagnosis Date Noted   No Resolved Ambulatory Problems   Past Medical History:  Diagnosis Date   Asthma    Diabetes mellitus without complication (HCC)    Endometriosis    Fibroid    HSV (herpes simplex virus) infection    Vaginal Pap smear, abnormal      Hospital course:  Induction of Labor With Vaginal Delivery   42 y.o. yo [phone] at [redacted]w[redacted]d was admitted to the hospital 11/27/2022 for induction of labor.  Indication for induction: TYPE 2 DM.  Patient had an labor course complicated bypt was admitted on 6/20 at 37 weeks for IOL for uncontrolled DM2, on insuline , was started on semglee in pt 12 units at night and sliding scale with meals. and will go home on this CBG have been stable 128 PP, 100, 79 and fasting this morning was 79, pt progresses to svd on 11/28/2022 over abrasion not repair with ebl of 295mls,a nd hgb drop of 12.5-10.1, asymptomatic, it progressed with cyctotec, pitocin and AROM.  Membrane Rupture Time/Date: 8:21 AM ,11/28/2022  Delivery Method:Vaginal, Spontaneous  Episiotomy: None  Lacerations:  None  Details of delivery can be found in separate delivery note.  Patient had a postpartum course complicated byN/A. Patient is discharged home 11/30/22.  Newborn Data: Birth date:11/28/2022  Birth time:8:34 AM  Gender:Female  Living status:Living  Apgars:8 ,8  Weight:3540 g  Postpartum Day # 2 : S/P NSVD due to see above. Patient up ad lib, denies syncope or dizziness. Reports consuming regular diet without issues and denies N/V. Patient reports 0 bowel movement + passing flatus.  Denies issues with urination and reports bleeding is "lighter."  Patient is breast pumpfeeding and reports going well.  Desires undecided for postpartum contraception.  Pain is being appropriately managed with use of po meds.    Physical exam  Vitals:   11/29/22 2200 11/30/22 0308 11/30/22 0747 11/30/22 1225  BP: (!) 154/85 118/67 (!) 115/58 119/63  Pulse: 92 74 99 88  Resp: 16 17 18 18   Temp: 98 F (36.7 C) 98 F (36.7 C) 98.6 F (37 C) 98.5 F (36.9 C)  TempSrc: Oral Oral Oral Oral  SpO2: 97% 98% 98% 100%  Weight:      Height:       General: alert, cooperative, and no distress Lochia: appropriate Uterine Fundus: firm Perineum: intact DVT Evaluation: No evidence of DVT seen on physical exam. Negative Homan's sign. No cords or calf tenderness. No significant calf/ankle edema.  Labs: Lab Results  Component Value Date   WBC 10.4 11/29/2022   HGB 10.1 (L) 11/29/2022   HCT 31.7 (L) 11/29/2022   MCV 87.8 11/29/2022   PLT 155 11/29/2022      Latest Ref Rng & Units 11/27/2022   11:56 AM  CMP  Glucose 70 - 99 mg/dL 88   BUN 6 - 20 mg/dL 8   Creatinine 6.57 - 8.46 mg/dL 9.62   Sodium 952 - 841 mmol/L 135   Potassium 3.5 - 5.1 mmol/L 3.9   Chloride 98 - 111 mmol/L 105   CO2 22 - 32 mmol/L 18   Calcium 8.9 - 10.3 mg/dL 9.3   Total Protein 6.5 - 8.1 g/dL 7.3   Total Bilirubin 0.3 - 1.2 mg/dL 0.8   Alkaline Phos 38 - 126 U/L 83   AST 15 - 41 U/L 21   ALT 0 - 44 U/L 14     Date of discharge: 11/30/2022 Discharge Diagnoses: Term Pregnancy-delivered Discharge instruction: per After Visit Summary and "Baby and Me Booklet".  After visit meds:   Activity:           unrestricted and pelvic rest Advance as tolerated. Pelvic rest for 6 weeks.  Diet:                routine Medications: PNV, Ibuprofen, and semglee  Postpartum contraception: Undecided Condition:  Pt discharge to home in  stable condition   Meds: Allergies as of 11/30/2022       Reactions   Metformin And Related Diarrhea, Nausea And Vomiting        Medication List     STOP taking these medications    aspirin EC 81 MG tablet   insulin glargine 100 UNIT/ML injection Commonly known as: LANTUS Replaced by: insulin  glargine-yfgn 100 UNIT/ML injection   insulin lispro 100 UNIT/ML injection Commonly known as: HUMALOG       TAKE these medications    ibuprofen 600 MG tablet Commonly known as: ADVIL Take 1 tablet (600 mg total) by mouth every 6 (six) hours.  insulin aspart 100 UNIT/ML injection Commonly known as: novoLOG Inject 0-20 Units into the skin 3 (three) times daily with meals.   insulin aspart 100 UNIT/ML injection Commonly known as: novoLOG Inject 0-5 Units into the skin at bedtime.   insulin glargine-yfgn 100 UNIT/ML injection Commonly known as: SEMGLEE Inject 0.12 mLs (12 Units total) into the skin daily. Start taking on: December 01, 2022 Replaces: insulin glargine 100 UNIT/ML injection   PRENATAL VITAMINS PO Take by mouth.        Discharge Follow Up:   Follow-up Information     [facility] Follow up.   Specialty: Obstetrics and Gynecology Why: f/u with endocrinology in 2 weeks for BS check, and ccob in 6 weeks for PPV Contact information: [address] [place] [address] [phone]                 [facility] CNM, FNP-C, PMHNP-BC  [id number]  [place], [place] [address]  Cell: [phone]  Office Phone: [phone] Fax: [phone] 11/30/2022  4:30 PM

## 2022-11-30 NOTE — Progress Notes (Signed)
Patients dexcom showed that patient's blood sugar is 100; POCT CBG showed that patient's blood sugar is 79. 21 point difference.

## 2022-12-01 ENCOUNTER — Ambulatory Visit (HOSPITAL_COMMUNITY): Payer: Self-pay

## 2022-12-01 NOTE — Lactation Note (Signed)
This note was copied from a baby's chart. Lactation Consultation Note  Patient Name: Boy Matteson Blue ZOXWR'U Date: 12/01/2022 Age:44 days   This LC attempted to visit with Ms. Auguste but she had already left for the day; NICU RN Dianna voiced she's bringing her breastmilk. OBSC RN Thayer Ohm voiced that she changed her mind prior her discharge and decided to give pumping a change. Asked RN to call out for lactation when she comes back to the unit.    Valleri Hendricksen Venetia Constable 12/01/2022, 5:38 PM

## 2022-12-02 ENCOUNTER — Ambulatory Visit (HOSPITAL_COMMUNITY): Payer: Self-pay

## 2022-12-02 NOTE — Lactation Note (Signed)
This note was copied from a baby's chart.  NICU Lactation Consultation Note  Patient Name: Kylie Gamble JOACZ'Y Date: 12/02/2022 Age:44 days  Reason for consult: Initial assessment; NICU baby; Exclusive pumping and bottle feeding; Early term 37-38.6wks  SUBJECTIVE  LC in to visit with P3 Mom of ET infant in the NICU.  Mom initially had decided to formula feed her baby, but decided to pump to provide breast milk for her baby.  Mom had been using a hand pump from her RN on her discharge.  Mom has been pumping every 2-3 hrs initially using the hand pump and then changing to a hand's free Lansinoh pump.    Mom brought in several bottles of 60 ml.EBM.  LC set up Mom to pump using Medela Symphony in the hand's free pumping band.  Mom encouraged to pump both breasts on maintenance setting every 3 hrs.  Talked to Mom about whether she was wanting to direct breast feed.  Mom scrunched up her face and said she didn't think so.    LC set up washing and drying bins.    OBJECTIVE Infant data: Mother's Current Feeding Choice: Breast Milk and Formula  Infant feeding assessment Scale for Readiness: 1 Scale for Quality: 2   Maternal data: [id number]  Vaginal, Spontaneous Pumping frequency: every 2-3 hrs Pumped volume: 60 mL Flange Size: 24  Pump: Hands Free (lansinoh hand's free pump)  ASSESSMENT Infant: Feeding Status: Ad lib  Maternal: Milk volume: Normal  INTERVENTIONS/PLAN Interventions: Interventions: Breast feeding basics reviewed; Skin to skin; Breast massage; Hand express; Hand pump; DEBP; Education Tools: Pump; Flanges; Bottle; Hands-free pumping top Pump Education: Setup, frequency, and cleaning; Milk Storage  Plan: Consult Status: NICU follow-up NICU Follow-up type: Verify absence of engorgement; Verify onset of copious milk   Judee Clara 12/02/2022, 4:25 PM

## 2022-12-03 ENCOUNTER — Ambulatory Visit (HOSPITAL_COMMUNITY): Payer: Self-pay

## 2022-12-03 NOTE — Lactation Note (Signed)
This note was copied from a baby's chart. Lactation Consultation Note  Patient Name: Kylie Gamble ZOXWR'U Date: 12/03/2022 Age:44 days   STORK pump delivered to baby's room.  RN notified as Mom is not here.  Pump placed on counter near pump parts.   Kylie Gamble 12/03/2022, 11:02 AM

## 2022-12-07 ENCOUNTER — Ambulatory Visit (HOSPITAL_COMMUNITY): Payer: Self-pay

## 2022-12-07 NOTE — Lactation Note (Signed)
This note was copied from a baby's chart.  NICU Lactation Consultation Note  Patient Name: Kylie Gamble ZOXWR'U Date: 12/07/2022 Age:44 days  Reason for consult: Weekly NICU follow-up; NICU baby; Exclusive pumping and bottle feeding; Early term 37-38.6wks; Maternal endocrine disorder Type of Endocrine Disorder?: Diabetes (T2DM (insulin))  SUBJECTIVE Visited with family of 34 days old ETI NICU female; Ms. Schomberg is a P3 and reports she's pumping consistently every 3 hours, praised her for her efforts. She endorses a Hx of low supply and said that she couldn't get more than 60 ml when she had her last baby. Discussed pumping schedule and strategies to increase supply; her plan is to exclusively pump and bottle feeding and supplement with formula. Ms. Dix is taking baby "Kylie Gamble" home today. Reviewed discharge education and the importance of consistent pumping to protect her supply. She politely declined a referral to [facility] OP; she has their contact information in case she needs to reach out. All questions and concerns answered, family to contact [facility] services PRN.  OBJECTIVE Infant data: Mother's Current Feeding Choice: Breast Milk and Formula  Infant feeding assessment Scale for Readiness: 1 Scale for Quality: 2   Maternal data: [id number]  Vaginal, Spontaneous Current breast feeding challenges:: NICU admission Pumping frequency: 7 times/24 hours Pumped volume: 60 mL Flange Size: 24 Risk factor for low milk supply:: infant separation, T2DM (insulin)  Pump: Stork Pump (Spectra DEBP)  ASSESSMENT Infant: Feeding Status: Ad lib  Maternal: Milk volume: Low  INTERVENTIONS/PLAN Interventions: Interventions: Breast feeding basics reviewed; DEBP; Education Discharge Education: Outpatient recommendation Tools: Pump; Flanges; Hands-free pumping top Pump Education: Setup, frequency, and cleaning; Milk Storage  Plan: Consult Status: Complete   Kamariya Blevens S Camisha Srey 12/07/2022, 1:09 PM

## 2022-12-18 ENCOUNTER — Inpatient Hospital Stay (HOSPITAL_COMMUNITY): Admit: 2022-12-18 | Payer: PRIVATE HEALTH INSURANCE

## 2022-12-22 ENCOUNTER — Telehealth (HOSPITAL_COMMUNITY): Payer: Self-pay | Admitting: *Deleted

## 2022-12-22 NOTE — Telephone Encounter (Signed)
12/22/2022  Name: Kylie Gamble MRN: [id number] DOB: 28-Feb-1981  Reason for Call:  Transition of Care Hospital Discharge Call  Contact Status: Patient Contact Status: Complete  Language assistant needed:          Follow-Up Questions: Do You Have Any Concerns About Your Health As You Heal From Delivery?: No Do You Have Any Concerns About Your Infants Health?: No  Edinburgh Postnatal Depression Scale:  In the Past 7 Days:   EPDS not completed at this time. Patient reported having completed EPDS with home visiting nurse last week. Stated, "I scored a 6." Patient continued, "I have an appointment with my therapist scheduled for 7/17, because I am starting to feel anxious." RN praised patient for her self-awareness. Encouraged patient to keep appointment with therapist. Patient denied any thoughts of self-harm. PHQ2-9 Depression Scale:     Discharge Follow-up: Edinburgh score requires follow up?: N/A Patient was advised of the following resources:: Support Group  Post-discharge interventions: Reviewed Newborn Safe Sleep Practices  Signature Deforest Hoyles, RN, [phone]
